# Patient Record
Sex: Female | Born: 1965 | Race: Black or African American | Hispanic: No | Marital: Married
Health system: Southern US, Community
[De-identification: ages and names within clinical notes are randomized; demographics above are authoritative.]

## PROBLEM LIST (undated history)

## (undated) DIAGNOSIS — K219 Gastro-esophageal reflux disease without esophagitis: Secondary | ICD-10-CM

## (undated) DIAGNOSIS — E7800 Pure hypercholesterolemia, unspecified: Secondary | ICD-10-CM

## (undated) DIAGNOSIS — E559 Vitamin D deficiency, unspecified: Secondary | ICD-10-CM

## (undated) DIAGNOSIS — E0500 Thyrotoxicosis with diffuse goiter without thyrotoxic crisis or storm: Secondary | ICD-10-CM

## (undated) DIAGNOSIS — R7303 Prediabetes: Secondary | ICD-10-CM

## (undated) DIAGNOSIS — F419 Anxiety disorder, unspecified: Secondary | ICD-10-CM

## (undated) DIAGNOSIS — M549 Dorsalgia, unspecified: Secondary | ICD-10-CM

## (undated) DIAGNOSIS — T7840XA Allergy, unspecified, initial encounter: Secondary | ICD-10-CM

## (undated) DIAGNOSIS — B019 Varicella without complication: Secondary | ICD-10-CM

## (undated) DIAGNOSIS — I10 Essential (primary) hypertension: Secondary | ICD-10-CM

## (undated) DIAGNOSIS — M2550 Pain in unspecified joint: Secondary | ICD-10-CM

## (undated) DIAGNOSIS — E039 Hypothyroidism, unspecified: Secondary | ICD-10-CM

## (undated) HISTORY — DX: Vitamin D deficiency, unspecified: E55.9

## (undated) HISTORY — DX: Prediabetes: R73.03

## (undated) HISTORY — DX: Gastro-esophageal reflux disease without esophagitis: K21.9

## (undated) HISTORY — DX: Varicella without complication: B01.9

## (undated) HISTORY — DX: Allergy, unspecified, initial encounter: T78.40XA

## (undated) HISTORY — DX: Thyrotoxicosis with diffuse goiter without thyrotoxic crisis or storm: E05.00

## (undated) HISTORY — DX: Essential (primary) hypertension: I10

## (undated) HISTORY — DX: Pain in unspecified joint: M25.50

## (undated) HISTORY — DX: Anxiety disorder, unspecified: F41.9

## (undated) HISTORY — DX: Pure hypercholesterolemia, unspecified: E78.00

## (undated) HISTORY — PX: TUBAL LIGATION: SHX77

## (undated) HISTORY — DX: Hypothyroidism, unspecified: E03.9

## (undated) HISTORY — PX: EYE SURGERY: SHX253

## (undated) HISTORY — DX: Dorsalgia, unspecified: M54.9

## (undated) HISTORY — PX: DILATION AND CURETTAGE OF UTERUS: SHX78

---

## 2001-08-07 ENCOUNTER — Other Ambulatory Visit: Admission: RE | Admit: 2001-08-07 | Discharge: 2001-08-07 | Payer: Self-pay | Admitting: Obstetrics & Gynecology

## 2005-06-30 ENCOUNTER — Ambulatory Visit: Payer: Self-pay | Admitting: Obstetrics & Gynecology

## 2005-07-15 ENCOUNTER — Ambulatory Visit (HOSPITAL_COMMUNITY): Admission: RE | Admit: 2005-07-15 | Discharge: 2005-07-15 | Payer: Self-pay | Admitting: Gynecology

## 2006-07-25 ENCOUNTER — Ambulatory Visit: Payer: Self-pay | Admitting: Family Medicine

## 2006-07-25 ENCOUNTER — Encounter: Payer: Self-pay | Admitting: Family Medicine

## 2006-07-25 LAB — CONVERTED CEMR LAB: Pap Smear: NORMAL

## 2006-07-31 ENCOUNTER — Ambulatory Visit (HOSPITAL_COMMUNITY): Admission: RE | Admit: 2006-07-31 | Discharge: 2006-07-31 | Payer: Self-pay | Admitting: Gynecology

## 2006-10-19 DIAGNOSIS — K219 Gastro-esophageal reflux disease without esophagitis: Secondary | ICD-10-CM

## 2006-10-19 DIAGNOSIS — E66813 Obesity, class 3: Secondary | ICD-10-CM | POA: Insufficient documentation

## 2006-10-19 DIAGNOSIS — E669 Obesity, unspecified: Secondary | ICD-10-CM

## 2006-11-14 ENCOUNTER — Ambulatory Visit: Payer: Self-pay | Admitting: Family Medicine

## 2006-11-14 DIAGNOSIS — N951 Menopausal and female climacteric states: Secondary | ICD-10-CM

## 2006-11-24 ENCOUNTER — Encounter (INDEPENDENT_AMBULATORY_CARE_PROVIDER_SITE_OTHER): Payer: Self-pay | Admitting: *Deleted

## 2007-07-03 ENCOUNTER — Ambulatory Visit: Payer: Self-pay | Admitting: Family Medicine

## 2007-07-24 ENCOUNTER — Ambulatory Visit: Payer: Self-pay | Admitting: Family Medicine

## 2007-08-06 ENCOUNTER — Ambulatory Visit (HOSPITAL_COMMUNITY): Admission: RE | Admit: 2007-08-06 | Discharge: 2007-08-06 | Payer: Self-pay | Admitting: Gynecology

## 2007-09-19 ENCOUNTER — Encounter: Admission: RE | Admit: 2007-09-19 | Discharge: 2007-09-19 | Payer: Self-pay | Admitting: Endocrinology

## 2007-09-28 ENCOUNTER — Encounter: Admission: RE | Admit: 2007-09-28 | Discharge: 2007-09-28 | Payer: Self-pay | Admitting: Endocrinology

## 2007-11-12 ENCOUNTER — Ambulatory Visit: Payer: Self-pay | Admitting: Obstetrics & Gynecology

## 2007-11-12 ENCOUNTER — Encounter: Payer: Self-pay | Admitting: Obstetrics & Gynecology

## 2009-11-30 ENCOUNTER — Ambulatory Visit: Payer: Self-pay | Admitting: Obstetrics & Gynecology

## 2010-03-09 ENCOUNTER — Encounter
Admission: RE | Admit: 2010-03-09 | Discharge: 2010-03-09 | Payer: Self-pay | Source: Home / Self Care | Attending: Ophthalmology | Admitting: Ophthalmology

## 2010-03-10 ENCOUNTER — Ambulatory Visit
Admission: RE | Admit: 2010-03-10 | Discharge: 2010-03-10 | Payer: Self-pay | Source: Home / Self Care | Attending: Ophthalmology | Admitting: Ophthalmology

## 2010-04-27 ENCOUNTER — Other Ambulatory Visit: Payer: Self-pay | Admitting: Obstetrics & Gynecology

## 2010-04-27 DIAGNOSIS — Z1231 Encounter for screening mammogram for malignant neoplasm of breast: Secondary | ICD-10-CM

## 2010-05-10 ENCOUNTER — Ambulatory Visit (HOSPITAL_COMMUNITY)
Admission: RE | Admit: 2010-05-10 | Discharge: 2010-05-10 | Disposition: A | Discharge status: Home / Self Care | Payer: Federal, State, Local not specified - PPO | Source: Ambulatory Visit | Attending: Obstetrics & Gynecology | Admitting: Obstetrics & Gynecology

## 2010-05-10 DIAGNOSIS — Z1231 Encounter for screening mammogram for malignant neoplasm of breast: Secondary | ICD-10-CM

## 2010-05-31 LAB — POCT HEMOGLOBIN-HEMACUE: Hemoglobin: 13 g/dL (ref 12.0–15.0)

## 2010-08-03 NOTE — Assessment & Plan Note (Signed)
NAME:  Delk, Myndi                ACCOUNT NO.:  [id number]   MEDICAL RECORD NO.:  [id number]          PATIENT TYPE:  POB   LOCATION:  [facility]         FACILITY:  [facility]   PHYSICIAN:  Johnella Moloney, MD        DATE OF BIRTH:  [birth year]   DATE OF SERVICE:  11/12/2007                                  CLINIC NOTE   CHIEF COMPLAINT:  Annual examination.   HISTORY OF PRESENT ILLNESS:  The patient is a 45 year old gravida 5,  para 2-0-3-2 with the last menstrual period of November 07, 2007, who is  here for her annual exam.  The patient was last seen in April 2009, for  a complaint of irregular periods.  As a part of the workup with this, a  TSH was checked which was normal.  The patient followed up with her  endocrinologist and was given a diagnosis of Graves disease.  The  patient has undergone radioactive iodine therapy in July 2009, and she  is due for another laboratory check in 6 weeks.  The patient is going to  follow up with her endocrinologist for the management of her Graves  disease.  As for her irregular bleeding, since her radioactive iodine  therapy, the patient has noted that her periods are now regular.  She  does say that they are slightly heavier than usual, but denies having to  change a pad every hour or 2 hours for any other symptoms.  The patient  denies any other gynecological issues.   PAST OBSTETRIC AND GYNECOLOGIC HISTORY:  The patient had 1 cesarean  section, 1 VBAC, and 3 terminations/miscarriages.  She has normal Pap  smears, last one was in Jul 25, 2006.  She denies any sexually  transmitted infections.   PAST MEDICAL HISTORY:  Graves disease.   PAST SURGICAL HISTORY:  Cesarean section and tubal ligation.   MEDICATIONS:  Centrum multivitamin daily.   ALLERGIES:  SULFA.   FAMILY HISTORY:  Notable for grandfather with throat cancer and the  patient also has an extensive family history of diabetes, heart disease,  and [place] blood pressure.   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  She does not smoke, drink alcohol, or use illicit  drugs.  She has never been sexually or physically abused.   REVIEW OF SYSTEMS:  A 14-point comprehensive review of systems was  reviewed with the patient and is negative.   PHYSICAL EXAMINATION:  VITAL SIGNS:  Pulse 71, blood pressure 134/85,  weight 233 pounds, and height 5 feet 4 inches.  GENERAL:  Well-nourished, well-developed female in no acute distress.  HEENT:  Normocephalic and atraumatic.  NECK:  Thyroid normal.  LUNGS:  Clear to auscultation bilaterally.  HEART:  Regular rate and rhythm.  BREASTS:  No masses, nipple discharge, or lymphadenopathy noted,  symmetric in size.  ABDOMEN:  Soft, nontender, and nondistended.  A well-healed Pfannenstiel  incision from her cesarean section and umbilical incision from her  bilateral tubal ligation.  PELVIC:  Normal external female genitalia.  Pink and well-rugated  vagina.  Normal discharge.  Multiparous  cervical os visualized.  Pap  smear sample obtained.  No abnormal drainage or discharge noted.  No  lesions noted on bimanual exam.  Uterus is nontender.  Normal size,  shape, and contour.  Normal adnexa bilaterally.  No tenderness elicited.  EXTREMITIES:  No cyanosis, clubbing, or edema.   ASSESSMENT AND PLAN:  The patient is a 45 year old female here for  annual exam.  A Pap smear was done today.  We will follow up results.  Of note, the patient had a mammogram that was done on Aug 06, 2007,  which was normal.  As for her obesity, the patient is continuing to  exercise and is on a diet.  Of note, she has lost about 27 pounds from  her last annual exam.  The patient was commented on her weight loss  effort and encouraged to continue.  She was told to come back to the GYN  clinic as needed for any gynecologic issues or if next year, for her  annual exam.            ______________________________  Johnella Moloney, MD     UD/MEDQ  D:  11/12/2007  T:  11/13/2007  Job:  [id number]

## 2010-08-03 NOTE — Assessment & Plan Note (Signed)
NAME:  Vanessa Gardner, Vanessa Gardner                ACCOUNT NO.:  [id number]   MEDICAL RECORD NO.:  [id number]          PATIENT TYPE:  POB   LOCATION:  [facility]         FACILITY:  [facility]   PHYSICIAN:  Allie Bossier, MD        DATE OF BIRTH:  [birth year]   DATE OF SERVICE:  11/30/2009                                  CLINIC NOTE   Vanessa Gardner is a 45 year old married black G4, P2, A2 who comes in here for  her annual exam.  She has a 48 year old son and a 68 year old daughter.  Her only complaint today is that of some urinary leakage.   PAST MEDICAL HISTORY:  Morbid obesity, Graves disease leading to  hypothyroidism - Dr. Talmage Nap.   SOCIAL HISTORY:  She has been married for 18 years.  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  She denies any dyspareunia.   PAST SURGICAL HISTORY:  Tubal ligation, D&C x2.   FAMILY HISTORY:  Negative for breast, GYN, and colon malignancies.   No latex allergies.  Medicine allergy is SULFA.   MEDICATIONS:  Synthroid 125 mcg daily.   SOCIAL HISTORY:  Negative for tobacco, alcohol, or drug use.   PHYSICAL EXAMINATION:  GENERAL:  Well-nourished, well-hydrated, very  pleasant black female, height 5-4, weight 272 pounds, blood pressure  133/99, pulse 73.  HEENT:  Normal.  BREASTS:  Normal bilaterally.  ABDOMEN:  Obese, benign.  HEART:  Regular rate and rhythm.  LUNGS:  Clear to auscultation bilaterally.  PELVIC:  External genitalia, no lesions.  Cervix parous.  Normal  discharge.  Uterus normal size and shape, anteverted, mobile.  Adnexa  nontender.  No masses.   ASSESSMENT AND PLAN:  1. Annual exam.  Checked the Pap smear.  Recommended self-breast and      self-vulvar exams.  We are scheduling a mammogram.  2. Encourage weight loss.  3. Stress incontinence.  I have recommended she go exercises and      weight loss and then we can consider surgery once her centripetal      obesity is less.      Allie Bossier, MD     MCD/MEDQ  D:  11/30/2009   T:  12/01/2009  Job:  [id number]

## 2010-08-03 NOTE — Assessment & Plan Note (Signed)
NAME:  Vanessa Gardner, Vanessa Gardner                ACCOUNT NO.:  [id number]   MEDICAL RECORD NO.:  [id number]          PATIENT TYPE:  POB   LOCATION:  [facility]         FACILITY:  [facility]   PHYSICIAN:  Tinnie Gens, MD        DATE OF BIRTH:  [birth year]   DATE OF SERVICE:  07/03/2007                                  CLINIC NOTE   CHIEF COMPLAINT:  Irregular periods.   HISTORY OF PRESENT ILLNESS:  This patient is a 45 year old gravida 5,  para 2-0-3-2, who normally has a cycle approximately every 25 days, and  she had two cycles in February that were approximately 17 days apart and  then a 30-day cycle in March.  The patient does have significant hot  flashes.  Her mother went through menopause in her mid 59s.  She has  lost almost 40 pounds since her last visit in May of last year.  Last  Pap and mammogram were within normal limits.   PHYSICAL EXAMINATION:  VITAL SIGNS:  Are as noted in the chart.  GENERAL:  She is an obese female in no acute distress.  GU:  Normal external female genitalia, BUS normal.  Vagina is pink and  rugated.  Cervix is parous without lesions.  Uterus was hard to outline,  does not feel markedly enlarged.  There are no adnexal masses or  tenderness.   IMPRESSION:  Irregular cycle x2.  It is hard to pinpoint exactly how  different this is from normal for her.  The lasted the same amount of  time.  She had no increase in bleeding.  Suspect this is hormonally  related.   PLAN:  Check TSH and FSH.  Get one more month fourth of cycles.  Will  see her in May for her yearly and review this ongoing period situation  at that time.  I do think the patient needs endometrial sampling unless  her cycles become increasingly heavy.           ______________________________  Tinnie Gens, MD     TP/MEDQ  D:  07/03/2007  T:  07/03/2007  Job:  [id number]

## 2010-08-06 NOTE — Group Therapy Note (Signed)
NAME:  Yard, Artina                ACCOUNT NO.:  [id number]   MEDICAL RECORD NO.:  [id number]          PATIENT TYPE:  POB   [facility]ATION[facility]  [facility]                   FACILITY:  [facility]   PHYSICIAN:  Tinnie Gens, MD        DATE OF BIRTH:  [birth year]   DATE OF SERVICE:  07/25/2006                                  CLINIC NOTE   Ms. Berryman is a 45 year old black female gravida 5, para 2-0-3-2 with her  last menstrual period of June 28, 2006.  She was last seen in the office  in April 2007.  She denies any problems.  She reports regular cycles  coming every month, lasting 4 days.  She denies menopausal symptoms.   PAST MEDICAL HISTORY:  Denies any medical problems.   PAST SURGICAL HISTORY:  Cesarean section and BTL.   PAST OBSTETRICAL HISTORY:  One cesarean section, one VBAC, and three  terminations/miscarriages.   GYNECOLOGICAL HISTORY:  No history of sexually-transmitted diseases.  Denies abnormal Pap smears.   ALLERGIES:  SULFA.   MEDICATIONS:  None.   REVIEW OF SYSTEMS:  A 13-point review of systems is negative.   FAMILY HISTORY:  Unchanged.   SOCIAL HISTORY:  The patient does not smoke.  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   PHYSICAL EXAMINATION:  VITAL SIGNS:  Pulse 71, blood pressure 147/87,  weight 259.  GENERAL:  Well-nourished, well-developed female in no acute distress.  HEENT:  Normocephalic.  Mouth:  No abnormalities noted.  NECK:  Thyroid without enlargement.  LUNGS:  Bilaterally clear to A&P.  HEART:  Rate and rhythm regular without murmurs, gallops or cardiac  enlargement.  BREASTS:  No masses or nipple discharge.  No lymphadenopathy.  ABDOMEN:  Soft and nontender without organomegaly.  She does have  central obesity.  GENITALIA:  External genitalia within normal limits for female.  Vagina  is clean and rugous.  The cervix is parous without irrigation or  discharge.  The uterus is nontender; normal size, shape and contour.  Adnexa  bilaterally nontender without palpable masses.  Rectovaginal exam  confirms.  EXTREMITIES:  No edema.   ASSESSMENT:  1. Normal gynecologic examination.  2. Obesity.   PLAN:  Pap smear was obtained and sent to the laboratory.  At the  patient's request, TSH was drawn.  Encouraged the patient to continue  her exercise program and to reduce portion sizes.  I encouraged her to  get back in touch with Dr. Lorenz Coaster to have someone to follow her  medically.  She is to return to the office in 1 year for her yearly  exam.     ______________________________  Matt Holmes, N.P.    ______________________________  Tinnie Gens, MD    EMK/MEDQ  D:  07/25/2006  T:  07/25/2006  Job:  [id number]

## 2010-08-06 NOTE — Group Therapy Note (Signed)
NAMELottie Gardner                 ACCOUNT NO.:  [id number]   MEDICAL RECORD NO.:  [id number]           PATIENT TYPE:   [facility]ATION[facility]  [facility]                     FACILITY:   PHYSICIAN:  Elsie Lincoln, MD           DATE OF BIRTH:   DATE OF SERVICE:  06/30/2005                                    CLINIC NOTE   The patient is a 45 year old, G5, para 2-0-3-2, last menstrual period June 13, 2005 who presents as a new patient for a routine physical exam.  The  patient's last Pap smear was 4 years ago and been last seen by Dr. Sharlot Gowda in  [place], but has not had a recent gynecological visit.  She has  absolutely no complaints.  She is sexually active and has no problems with  libido.  She has no urinary complaints.  She has regular periods.  She has  no fatigue, depression, hot flashes or flushes.  She does want a Pap smear  today.  She has never had a mammogram and she has never had a guaiac done of  her stool.   PAST MEDICAL HISTORY:  Denies all medical problems.   PAST SURGICAL HISTORY:  C-section and BTL.   PAST OBSTETRICAL HISTORY:  One C-section and 1 VBAC, and 3  terminations/miscarriages.   GYNECOLOGICAL HISTORY:  No history of sexually transmitted diseases.  No  abnormal Pap smears.  No ovarian cysts and no fibroid tumors of the uterus.   ALLERGIES:  SULFA.   MEDICATIONS:  None.   REVIEW OF SYMPTOMS:  A 13-point review of symptoms is negative.   FAMILY HISTORY:  Grandparents have diabetes, father has heart disease,  mother and father have Pons blood pressure and a grandfather has cancer.   SOCIAL HISTORY:  She does not smoke.  She drinks and occasional alcohol  drink and 1 caffeinated beverage a day.  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  She has never been sexually or physically  abused.   PHYSICAL EXAMINATION:  VITAL SIGNS:  Blood pressure 124/88, weight 251 and  pulse 76.  GENERAL:  Well-nourished, well-developed in no apparent  distress.  HEENT:  Normocephalic, atraumatic.  MOUTH:  Good dentition.  NECK:  Thyroid normal.  LUNGS:  Clear to auscultation bilaterally.  HEART:  Regular rate and rhythm.  BREASTS:  No masses, skin changes, dimpling or breast discharge.  No  lymphadenopathy.  ABDOMEN:  Soft, nontender and nondistended.  No organomegaly.  No hernia.  Well-healed umbilical skin incision from tubal and well-healed Pfannenstiel  skin incision from C-section.  GENITALIA:  Tanner V.  Vulva no lesions.  Urethra no prolapse.  Urethra  meatus no prolapse.  Urethra nontender.  Vagina pink, normal rugae and no  blood or discharge.  Cervix closed and nontender.  Uterus and adnexa are  difficult to palpate secondary to body habitus, however, nontender.  Rectovaginal exam no masses or hemorrhoids.  Guaiac done.  EXTREMITIES:  No edema.   ASSESSMENT AND PLAN:  A 45 year old female for Pap smear, cultures,  mammogram and  guaiac.  The patient is obese and was told to continue  exercising and to begin her diet by cutting out beverages that have  calories.  She does need to come back in for a TSH, fasting glucose and  fasting lipid panel.  We will then call her with those results and then have  her come back in 1 year for an exam or sooner as needed.           ______________________________  Elsie Lincoln, MD     KL/MEDQ  D:  06/30/2005  T:  06/30/2005  Job:  [id number]

## 2011-03-22 HISTORY — PX: EYE SURGERY: SHX253

## 2011-05-25 ENCOUNTER — Other Ambulatory Visit: Payer: Self-pay | Admitting: Obstetrics & Gynecology

## 2011-05-25 DIAGNOSIS — Z1231 Encounter for screening mammogram for malignant neoplasm of breast: Secondary | ICD-10-CM

## 2011-06-20 ENCOUNTER — Ambulatory Visit (HOSPITAL_COMMUNITY): Payer: Federal, State, Local not specified - PPO

## 2015-08-24 ENCOUNTER — Encounter: Payer: Self-pay | Admitting: Primary Care

## 2015-08-24 ENCOUNTER — Ambulatory Visit (INDEPENDENT_AMBULATORY_CARE_PROVIDER_SITE_OTHER): Payer: Federal, State, Local not specified - PPO | Admitting: Primary Care

## 2015-08-24 VITALS — BP 122/70 | HR 63 | Temp 98.0°F | Ht 63.5 in | Wt 253.1 lb

## 2015-08-24 DIAGNOSIS — E039 Hypothyroidism, unspecified: Secondary | ICD-10-CM | POA: Diagnosis not present

## 2015-08-24 DIAGNOSIS — N926 Irregular menstruation, unspecified: Secondary | ICD-10-CM | POA: Diagnosis not present

## 2015-08-24 DIAGNOSIS — E669 Obesity, unspecified: Secondary | ICD-10-CM

## 2015-08-24 DIAGNOSIS — K219 Gastro-esophageal reflux disease without esophagitis: Secondary | ICD-10-CM

## 2015-08-24 NOTE — Progress Notes (Signed)
Subjective:    Patient ID: Vanessa LentFilicia M Gardner, female    DOB: Aug 15, 1965, 50 y.o.   MRN: [id number]  HPI  Vanessa Gardner is a 50 year old female who presents today to establish care and discuss the problems mentioned below. Will obtain old records. Her last physical was in April 2016. She follows with GYN annually.   1) Hypothyroidism: Diagnosed in 2009. Currently managed on levothyroxine 137 mcg and has been on this dose since November 2016. Denies weight loss, hair loss, fatigue. She follows with an endocrinologist.   2) Essential Hypertension: Diagnosed in 2011. Currently managed on losartan-HCTZ 50/12.5 mg. Her blood pressure is stable in the clinic today. Denies chest pain, SOB, dizziness, lower extremity edema.  3) Irregular Menstrual Cycle: Present for the past 2 years. Menstrual cycles over the past 2 years were every 24-26 days. Her LMP was 06/11/15. Denies any chance of pregnancy. Tubal ligation in her mid 30's. She is experiencing night sweats. Denies unexplained weight loss, pelvic pain, cramping.  4) Obesity: Present for years. She was once successful on weight watchers as she lost 40 pounds. She has not been exercising and has not been eating as healthy as she once did in the past. Lately she's been eating out at restaurants.   Her diet currently consists of: Breakfast:Boiled eggs, [place]aw[place]urkey bacon Lunch: Protein, vegetables, salad, eating out Dinner: Protein, vegetables, salad, eating out Snacks: Fruit, chips, popcorn Desserts: Occasionally Beverages: Water (24 ounces daily), coffee, occasional diet soda  Exercise: She does not currently exercise, but plans on starting today as she joined a new gym.  Review of Systems  Constitutional: Negative for unexpected weight change.  Respiratory: Negative for shortness of breath.   Cardiovascular: Negative for chest pain.  Gastrointestinal:       Gerd  Genitourinary: Negative for dysuria, frequency, vaginal discharge and pelvic pain.   Irregular menstrual cycle  Neurological: Negative for dizziness and headaches.       Past Medical History  Diagnosis Date  . Chicken pox   . GERD (gastroesophageal reflux disease)   . Hypothyroidism   . Essential hypertension   . Graves disease      Social History   Social History  . Marital Status: Married    Spouse Name: N/A  . Number of Children: N/A  . Years of Education: N/A   Occupational History  . Not on file.   Social History Main Topics  . Smoking status: Never Smoker   . Smokeless tobacco: Not on file  . Alcohol Use: 0.0 oz/week    0 Standard drinks or equivalent per week  . Drug Use: No  . Sexual Activity: Not on file   Other Topics Concern  . Not on file   Social History Narrative   Married.   2 children.   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   Enjoys exercising, going to the movies, shopping.     Past Surgical History  Procedure Laterality Date  . Tubal ligation    . Eye surgery Right 2013    Family History  Problem Relation Age of Onset  . Alcohol abuse Father   . Hypertension Father   . Cirrhosis Father   . Hypertension Mother     Allergies  Allergen Reactions  . Cefuroxime Axetil     REACTION: swelling  . Norvasc [Amlodipine Besylate] Swelling  . Sulfa Antibiotics     No current outpatient prescriptions on file prior to visit.   No current facility-administered medications on file  prior to visit.    BP 122/70 mmHg  Pulse 63  Temp(Src) 98 F (36.7 C) (Oral)  Ht 5' 3.5" (1.613 m)  Wt 253 lb 1.9 oz (114.814 kg)  BMI 44.13 kg/m2  SpO2 96%  LMP 08/21/2015    Objective:   Physical Exam  Constitutional: She appears well-nourished.  Neck: Neck supple.  Cardiovascular: Normal rate and regular rhythm.   Pulmonary/Chest: Effort normal and breath sounds normal.  Skin: Skin is warm and dry.  Psychiatric: She has a normal mood and affect.          Assessment & Plan:

## 2015-08-24 NOTE — Assessment & Plan Note (Signed)
Occasional. Manages with avoiding triggers and avoid laying flat after meals. Not currently on medication.

## 2015-08-24 NOTE — Progress Notes (Signed)
Pre visit review using our clinic review tool, if applicable. No additional management support is needed unless otherwise documented below in the visit note. 

## 2015-08-24 NOTE — Assessment & Plan Note (Signed)
Changes in menstrual cycle for the past 2 years. LMP June 11, 2015. History of Tubal Ligation in her mid 5830's. Suspect peri-menopausal as she lacks any other symptoms. Does have night sweats.  Will have her continue to monitor cycles.

## 2015-08-24 NOTE — Assessment & Plan Note (Signed)
Long history of, once successful on weight watchers, plans on re-starting. Fair diet, no exercise, not drinking enough water. Discussed changes in diet, increase consumption of water intake, and start exercising.

## 2015-08-24 NOTE — Assessment & Plan Note (Signed)
Currently managed by endocrinology. Stable on levothyroxine 137 mcg. Denies fatigue, weight loss, hair loss.

## 2015-08-24 NOTE — Patient Instructions (Signed)
Continue to work on improvements in your diet as discussed.  Ensure you are consuming 64 ounces of water daily.  Start exercising. You should be getting 1 hour of moderate intensity exercise 5 days weekly.  Please schedule a physical with me this summer at your convenience. You may also schedule a lab only appointment 3-4 days prior. We will discuss your lab results in detail during your physical.  It was a pleasure to meet you today! Please don't hesitate to call me with any questions. Welcome to [facility]!

## 2015-09-17 ENCOUNTER — Other Ambulatory Visit: Payer: Self-pay | Admitting: Primary Care

## 2015-09-17 DIAGNOSIS — E039 Hypothyroidism, unspecified: Secondary | ICD-10-CM

## 2015-09-17 DIAGNOSIS — Z0000 Encounter for general adult medical examination without abnormal findings: Secondary | ICD-10-CM

## 2015-09-17 DIAGNOSIS — I10 Essential (primary) hypertension: Secondary | ICD-10-CM

## 2015-09-25 ENCOUNTER — Other Ambulatory Visit (INDEPENDENT_AMBULATORY_CARE_PROVIDER_SITE_OTHER): Payer: Federal, State, Local not specified - PPO

## 2015-09-25 DIAGNOSIS — Z0000 Encounter for general adult medical examination without abnormal findings: Secondary | ICD-10-CM | POA: Diagnosis not present

## 2015-09-25 DIAGNOSIS — I10 Essential (primary) hypertension: Secondary | ICD-10-CM

## 2015-09-25 DIAGNOSIS — E039 Hypothyroidism, unspecified: Secondary | ICD-10-CM

## 2015-09-25 LAB — COMPREHENSIVE METABOLIC PANEL
ALBUMIN: 3.9 g/dL (ref 3.5–5.2)
ALK PHOS: 80 U/L (ref 39–117)
ALT: 20 U/L (ref 0–35)
AST: 28 U/L (ref 0–37)
BILIRUBIN TOTAL: 0.4 mg/dL (ref 0.2–1.2)
BUN: 15 mg/dL (ref 6–23)
CO2: 32 mEq/L (ref 19–32)
Calcium: 9.4 mg/dL (ref 8.4–10.5)
Chloride: 102 mEq/L (ref 96–112)
Creatinine, Ser: 0.84 mg/dL (ref 0.40–1.20)
GFR: 91.84 mL/min (ref >60.00)
Glucose, Bld: 102 mg/dL — ABNORMAL HIGH (ref 70–99)
POTASSIUM: 3.7 meq/L (ref 3.5–5.1)
Sodium: 138 mEq/L (ref 135–145)
TOTAL PROTEIN: 7.2 g/dL (ref 6.0–8.3)

## 2015-09-25 LAB — LIPID PANEL
Cholesterol: 170 mg/dL (ref 0–200)
HDL: 44.1 mg/dL (ref >39.00)
LDL CALC: 110 mg/dL — AB (ref 0–99)
NonHDL: 125.75
TRIGLYCERIDES: 81 mg/dL (ref 0.0–149.0)
Total CHOL/HDL Ratio: 4
VLDL: 16.2 mg/dL (ref 0.0–40.0)

## 2015-09-25 LAB — VITAMIN D 25 HYDROXY (VIT D DEFICIENCY, FRACTURES): VITD: 35.7 ng/mL (ref 30.00–100.00)

## 2015-09-25 LAB — TSH: TSH: 0.78 u[IU]/mL (ref 0.35–4.50)

## 2015-09-25 LAB — HEMOGLOBIN A1C: HEMOGLOBIN A1C: 5.9 % (ref 4.6–6.5)

## 2015-10-01 ENCOUNTER — Encounter: Payer: Self-pay | Admitting: Primary Care

## 2015-10-01 ENCOUNTER — Ambulatory Visit (INDEPENDENT_AMBULATORY_CARE_PROVIDER_SITE_OTHER): Payer: Federal, State, Local not specified - PPO | Admitting: Primary Care

## 2015-10-01 VITALS — BP 122/74 | HR 63 | Temp 98.2°F | Ht 64.0 in | Wt 252.1 lb

## 2015-10-01 DIAGNOSIS — Z1211 Encounter for screening for malignant neoplasm of colon: Secondary | ICD-10-CM

## 2015-10-01 DIAGNOSIS — N926 Irregular menstruation, unspecified: Secondary | ICD-10-CM | POA: Diagnosis not present

## 2015-10-01 DIAGNOSIS — E669 Obesity, unspecified: Secondary | ICD-10-CM

## 2015-10-01 DIAGNOSIS — Z23 Encounter for immunization: Secondary | ICD-10-CM | POA: Diagnosis not present

## 2015-10-01 DIAGNOSIS — R7303 Prediabetes: Secondary | ICD-10-CM | POA: Insufficient documentation

## 2015-10-01 DIAGNOSIS — E039 Hypothyroidism, unspecified: Secondary | ICD-10-CM

## 2015-10-01 DIAGNOSIS — Z0001 Encounter for general adult medical examination with abnormal findings: Secondary | ICD-10-CM | POA: Insufficient documentation

## 2015-10-01 DIAGNOSIS — Z0000 Encounter for general adult medical examination without abnormal findings: Secondary | ICD-10-CM

## 2015-10-01 NOTE — Assessment & Plan Note (Signed)
A1C of 5.9 on recent labs. Discussed changes in diet. She is exercising, commended her on these efforts.  Will repeat A1C in 6 months.

## 2015-10-01 NOTE — Assessment & Plan Note (Signed)
Exercising in the gym and also with boot camp. Commended her on her efforts. Discussed the importance of a healthy diet and regular exercise in order for weight loss and to reduce risk of other medical diseases.

## 2015-10-01 NOTE — Assessment & Plan Note (Signed)
TSH stable on recent labs. Continue levothyroxine 137 mcg.

## 2015-10-01 NOTE — Assessment & Plan Note (Signed)
Td due, provided today. Pap and mammogram UTD. Colonoscopy due, referral placed. Eye and dental exams UTD. Exam unremarkable. Labs with prediabetes, discussed this with patient. Follow up in 1 year for repeat physical.

## 2015-10-01 NOTE — Addendum Note (Signed)
Addended by: Tawnya CrookSAMBATH, Saide Lanuza on: 10/01/2015 11:19 AM   Modules accepted: Orders, SmartSet

## 2015-10-01 NOTE — Patient Instructions (Signed)
You were provided with a tetanus vaccination that will cover you for 10 years.  Continue to make improvements on your diet. Limit carbohydrates in the form of white bread, rice, pasta, chips, crackers, etc. Increase your consumption of fresh fruits and vegetables.  Ensure you are consuming 64 ounces of water daily.  Continue exercising! Great job!  Start a Calcium with Vitamin D supplement. Take this daily.  You will be contacted regarding your referral to GI for the colonoscopy.  Please let us know if you have not heard back within one week.   We will continue to monitor your blood sugar levels. Schedule a lab only appointment in 6 months.  Follow up in 1 year for repeat physical or sooner if needed.  It was a pleasure to see you today!

## 2015-10-01 NOTE — Assessment & Plan Note (Signed)
Continues. Also with hot flashes. Discussed perimenopausal symptoms and process with patient.

## 2015-10-01 NOTE — Progress Notes (Signed)
Subjective:    Patient ID: Vanessa Gardner, female    DOB: [birth year], 50 y.o.   MRN: [id number]  HPI  Vanessa Gardner is a 50 year old female who presents today for complete physical.  Immunizations: -Tetanus: Last completed in 2003, due today. -Influenza: Did not complete last season.  Diet: She endorses a fair diet. Breakfast: Boiled egg, [place] bacon Lunch: Meat, vegetable, salad, pasta; does eat out occasionally (tries to make healthier choices). Dinner: Meat, vegetables, popcorn, chips Snacks: Yogurt, chips, popcorn, crackers Desserts: Occasionally Beverages: Water (limited), occasional diet, coffee  Exercise: Currently involved in exercises classes several days weekly. Eye exam: Completed in March 2017 Dental exam: Completed in February 2017 Colonoscopy: Never completed, due. Pap Smear: Completed in September 2016, normal. Mammogram: Completed in September 2016, normal.   Review of Systems  Constitutional: Negative for unexpected weight change.  HENT: Negative for rhinorrhea.   Respiratory: Negative for cough and shortness of breath.   Cardiovascular: Negative for chest pain.  Gastrointestinal: Negative for diarrhea and constipation.  Genitourinary: Negative for difficulty urinating.       Perimenopausal symptoms  Musculoskeletal: Negative for myalgias and arthralgias.  Skin: Negative for rash.  Allergic/Immunologic: Negative for environmental allergies.  Neurological: Negative for dizziness, numbness and headaches.  Psychiatric/Behavioral:       Denies concerns for anxiety or depression.       Past Medical History  Diagnosis Date  . Chicken pox   . GERD (gastroesophageal reflux disease)   . Hypothyroidism   . Essential hypertension   . Graves disease      Social History   Social History  . Marital Status: Married    Spouse Name: N/A  . Number of Children: N/A  . Years of Education: N/A   Occupational History  . Not on file.   Social History Main  Topics  . Smoking status: Never Smoker   . Smokeless tobacco: Not on file  . Alcohol Use: 0.0 oz/week    0 Standard drinks or equivalent per week  . Drug Use: No  . Sexual Activity: Not on file   Other Topics Concern  . Not on file   Social History Narrative   Married.   2 children.   Works as a Child psychotherapist.   Enjoys exercising, going to the movies, shopping.     Past Surgical History  Procedure Laterality Date  . Tubal ligation    . Eye surgery Right 2013    Family History  Problem Relation Age of Onset  . Alcohol abuse Father   . Hypertension Father   . Cirrhosis Father   . Hypertension Mother     Allergies  Allergen Reactions  . Cefuroxime Axetil     REACTION: swelling  . Norvasc [Amlodipine Besylate] Swelling  . Sulfa Antibiotics     Current Outpatient Prescriptions on File Prior to Visit  Medication Sig Dispense Refill  . cholecalciferol (VITAMIN D) 1000 units tablet Take 1,000 Units by mouth daily.    Marland Kitchen levothyroxine (SYNTHROID, LEVOTHROID) 137 MCG tablet Take 137 mcg by mouth daily before breakfast.    . losartan-hydrochlorothiazide (HYZAAR) 50-12.5 MG tablet Take 1 tablet by mouth daily.     No current facility-administered medications on file prior to visit.    BP 122/74 mmHg  Pulse 63  Temp(Src) 98.2 F (36.8 C) (Oral)  Ht  (1.626 m)  Wt 252 lb 1.9 oz (114.361 kg)  BMI 43.26 kg/m2  SpO2 97%  LMP 08/21/2015  Objective:   Physical Exam  Constitutional: She is oriented to person, place, and time. She appears well-nourished.  HENT:  Right Ear: Tympanic membrane and ear canal normal.  Left Ear: Tympanic membrane and ear canal normal.  Nose: Nose normal.  Mouth/Throat: Oropharynx is clear and moist.  Eyes: Conjunctivae and EOM are normal. Pupils are equal, round, and reactive to light.  Neck: Neck supple. No thyromegaly present.  Cardiovascular: Normal rate and regular rhythm.   No murmur heard. Pulmonary/Chest: Effort normal and  breath sounds normal. She has no rales.  Abdominal: Soft. Bowel sounds are normal. There is no tenderness.  Musculoskeletal: Normal range of motion.  Lymphadenopathy:    She has no cervical adenopathy.  Neurological: She is alert and oriented to person, place, and time. She has normal reflexes. No cranial nerve deficit.  Skin: Skin is warm and dry. No rash noted.  Psychiatric: She has a normal mood and affect.          Assessment & Plan:

## 2015-10-01 NOTE — Progress Notes (Signed)
Pre visit review using our clinic review tool, if applicable. No additional management support is needed unless otherwise documented below in the visit note. 

## 2015-10-06 ENCOUNTER — Encounter: Payer: Self-pay | Admitting: Gastroenterology

## 2015-12-01 ENCOUNTER — Ambulatory Visit (AMBULATORY_SURGERY_CENTER): Payer: Self-pay | Admitting: *Deleted

## 2015-12-01 VITALS — Ht 64.5 in | Wt 254.8 lb

## 2015-12-01 DIAGNOSIS — Z1211 Encounter for screening for malignant neoplasm of colon: Secondary | ICD-10-CM

## 2015-12-01 MED ORDER — NA SULFATE-K SULFATE-MG SULF 17.5-3.13-1.6 GM/177ML PO SOLN: 1.0000 | Freq: Once | ORAL | 0 refills | Status: AC

## 2015-12-01 NOTE — Progress Notes (Signed)
Denies allergies to eggs or soy products. Denies complications with sedation or anesthesia. Denies O2 use. Denies use of diet or weight loss medications.  Emmi instructions given for colonoscopy.  

## 2015-12-07 ENCOUNTER — Encounter: Payer: Self-pay | Admitting: Gastroenterology

## 2015-12-15 ENCOUNTER — Encounter: Payer: Federal, State, Local not specified - PPO | Admitting: Gastroenterology

## 2016-03-24 ENCOUNTER — Other Ambulatory Visit: Payer: Self-pay | Admitting: Primary Care

## 2016-03-24 DIAGNOSIS — R7303 Prediabetes: Secondary | ICD-10-CM

## 2016-04-04 ENCOUNTER — Other Ambulatory Visit (INDEPENDENT_AMBULATORY_CARE_PROVIDER_SITE_OTHER): Payer: Federal, State, Local not specified - PPO

## 2016-04-04 DIAGNOSIS — R7303 Prediabetes: Secondary | ICD-10-CM | POA: Diagnosis not present

## 2016-04-04 LAB — HEMOGLOBIN A1C: Hgb A1c MFr Bld: 5.8 % (ref 4.6–6.5)

## 2017-03-22 ENCOUNTER — Telehealth: Payer: Self-pay | Admitting: Primary Care

## 2017-03-22 DIAGNOSIS — Z0000 Encounter for general adult medical examination without abnormal findings: Secondary | ICD-10-CM

## 2017-03-22 DIAGNOSIS — R7303 Prediabetes: Secondary | ICD-10-CM

## 2017-03-22 DIAGNOSIS — E039 Hypothyroidism, unspecified: Secondary | ICD-10-CM

## 2017-03-22 NOTE — Telephone Encounter (Signed)
Noted, lab orders placed. 

## 2017-03-22 NOTE — Telephone Encounter (Signed)
Copied from CRM [phone]. Topic: General - Other >> Mar 22, 2017 10:39 AM Cipriano BunkerLambe, Annette S wrote: Reason for CRM: patient coming in for physical on 1/28,  she has appt. For labs on 1/25. Will need lab order

## 2017-04-14 ENCOUNTER — Other Ambulatory Visit (INDEPENDENT_AMBULATORY_CARE_PROVIDER_SITE_OTHER): Payer: Federal, State, Local not specified - PPO

## 2017-04-14 DIAGNOSIS — E039 Hypothyroidism, unspecified: Secondary | ICD-10-CM | POA: Diagnosis not present

## 2017-04-14 DIAGNOSIS — Z0000 Encounter for general adult medical examination without abnormal findings: Secondary | ICD-10-CM | POA: Diagnosis not present

## 2017-04-14 DIAGNOSIS — R7303 Prediabetes: Secondary | ICD-10-CM

## 2017-04-14 LAB — LIPID PANEL
Cholesterol: 172 mg/dL (ref 0–200)
HDL: 48.1 mg/dL (ref >39.00)
LDL CALC: 110 mg/dL — AB (ref 0–99)
NONHDL: 123.59
Total CHOL/HDL Ratio: 4
Triglycerides: 68 mg/dL (ref 0.0–149.0)
VLDL: 13.6 mg/dL (ref 0.0–40.0)

## 2017-04-14 LAB — COMPREHENSIVE METABOLIC PANEL
ALT: 17 U/L (ref 0–35)
AST: 25 U/L (ref 0–37)
Albumin: 4 g/dL (ref 3.5–5.2)
Alkaline Phosphatase: 69 U/L (ref 39–117)
BILIRUBIN TOTAL: 0.5 mg/dL (ref 0.2–1.2)
BUN: 19 mg/dL (ref 6–23)
CO2: 33 meq/L — AB (ref 19–32)
CREATININE: 0.82 mg/dL (ref 0.40–1.20)
Calcium: 9.1 mg/dL (ref 8.4–10.5)
Chloride: 99 mEq/L (ref 96–112)
GFR: 93.86 mL/min (ref >60.00)
GLUCOSE: 94 mg/dL (ref 70–99)
Potassium: 3.3 mEq/L — ABNORMAL LOW (ref 3.5–5.1)
SODIUM: 139 meq/L (ref 135–145)
Total Protein: 7.6 g/dL (ref 6.0–8.3)

## 2017-04-14 LAB — HEMOGLOBIN A1C: Hgb A1c MFr Bld: 6 % (ref 4.6–6.5)

## 2017-04-14 LAB — TSH: TSH: 0.25 u[IU]/mL — AB (ref 0.35–4.50)

## 2017-04-17 ENCOUNTER — Ambulatory Visit (INDEPENDENT_AMBULATORY_CARE_PROVIDER_SITE_OTHER): Payer: Federal, State, Local not specified - PPO | Admitting: Primary Care

## 2017-04-17 ENCOUNTER — Encounter: Payer: Self-pay | Admitting: Primary Care

## 2017-04-17 VITALS — BP 124/70 | HR 71 | Temp 98.1°F | Ht 64.5 in | Wt 258.4 lb

## 2017-04-17 DIAGNOSIS — Z1231 Encounter for screening mammogram for malignant neoplasm of breast: Secondary | ICD-10-CM

## 2017-04-17 DIAGNOSIS — R7303 Prediabetes: Secondary | ICD-10-CM

## 2017-04-17 DIAGNOSIS — Z1211 Encounter for screening for malignant neoplasm of colon: Secondary | ICD-10-CM

## 2017-04-17 DIAGNOSIS — E669 Obesity, unspecified: Secondary | ICD-10-CM | POA: Diagnosis not present

## 2017-04-17 DIAGNOSIS — E039 Hypothyroidism, unspecified: Secondary | ICD-10-CM | POA: Diagnosis not present

## 2017-04-17 DIAGNOSIS — Z0000 Encounter for general adult medical examination without abnormal findings: Secondary | ICD-10-CM | POA: Diagnosis not present

## 2017-04-17 DIAGNOSIS — E876 Hypokalemia: Secondary | ICD-10-CM

## 2017-04-17 DIAGNOSIS — Z1239 Encounter for other screening for malignant neoplasm of breast: Secondary | ICD-10-CM

## 2017-04-17 MED ORDER — LEVOTHYROXINE SODIUM 125 MCG PO TABS: 125.0000 ug | ORAL_TABLET | Freq: Every day | ORAL | 1 refills | Status: DC

## 2017-04-17 NOTE — Patient Instructions (Signed)
We've reduced your dose of levothyroxine from 137 mcg to 125 mcg. Take 1 tablet by mouth every morning on an empty stomach with a full glass of water.  [facility].  You will be contacted regarding your referral to GI for the colonoscopy.  Please let us know if you have not been contacted within one week.   Continue exercising. You should be getting 150 minutes of moderate intensity exercise weekly.  Continue to work on your diet through [employer].  Schedule a lab appointment in 6 weeks to recheck your thyroid function and potassium.   Schedule a lab only appointment in 6 months to recheck your sugar levels.  Follow up in 1 year for your annual exam or sooner if needed.  It was a pleasure to see you today!

## 2017-04-17 NOTE — Assessment & Plan Note (Signed)
Recent TSH of 0.25. Decrease levothyroxine to 125 mcg once daily. Repeat TSH in 6 weeks.

## 2017-04-17 NOTE — Progress Notes (Signed)
Subjective:    Patient ID: Vanessa Gardner, female    DOB: January 17, 1966, 52 y.o.   MRN: [id number]  HPI  Vanessa Gardner is a 52 year old female who presents today for complete physical. She is also needing medication refills.   She is no longer following with endocrinology and has not had her TSH checked since 2017. She is still compliant to her levothyroxine to her dose of 137 mcg.    Immunizations: -Tetanus: Completed in 2017 -Influenza: Completed in October 2018   Diet: She endorses a healthy diet. She joined [employer] watchers yesterday.  Breakfast: Boiled eggs, [place] bacon Lunch: Green beans, chicken; fast food Dinner: meat, vegetable Snacks: Candy, popcorn, yogurt Desserts: Daily Beverages: Water, flavored sports drink  Exercise: She is active through boot camp three days weekly, one Zumba class weekly.  Eye exam: Completed in March 2018 Dental exam: Completes semi-annually. Colonoscopy: Never completed.  Pap Smear: Completed in 2017, normal. Due in 2020 Mammogram: Due  Review of Systems  Constitutional: Negative for unexpected weight change.  HENT: Negative for rhinorrhea.   Respiratory: Negative for cough and shortness of breath.   Cardiovascular: Negative for chest pain.  Gastrointestinal: Negative for constipation and diarrhea.  Genitourinary: Negative for difficulty urinating and menstrual problem.  Musculoskeletal: Positive for arthralgias. Negative for myalgias.       Intermittent right knee pain  Skin: Negative for rash.  Allergic/Immunologic: Negative for environmental allergies.  Neurological: Negative for dizziness, numbness and headaches.       Past Medical History:  Diagnosis Date  . Chicken pox   . Essential hypertension   . GERD (gastroesophageal reflux disease)   . Graves disease   . Hypothyroidism      Social History   Socioeconomic History  . Marital status: Married    Spouse name: Not on file  . Number of children: Not on file  . Years of  education: Not on file  . Highest education level: Not on file  Social Needs  . Financial resource strain: Not on file  . Food insecurity - worry: Not on file  . Food insecurity - inability: Not on file  . Transportation needs - medical: Not on file  . Transportation needs - non-medical: Not on file  Occupational History  . Not on file  Tobacco Use  . Smoking status: Never Smoker  . Smokeless tobacco: Never Used  Substance and Sexual Activity  . Alcohol use: Yes    Alcohol/week: 0.0 oz    Comment: occas  . Drug use: No  . Sexual activity: Not on file  Other Topics Concern  . Not on file  Social History Narrative   Married.   2 children.   Works as a Child psychotherapist.   Enjoys exercising, going to the movies, shopping.     Past Surgical History:  Procedure Laterality Date  . EYE SURGERY Right 2013  . TUBAL LIGATION      Family History  Problem Relation Age of Onset  . Alcohol abuse Father   . Hypertension Father   . Cirrhosis Father   . Hypertension Mother   . Colon cancer Neg Hx     Allergies  Allergen Reactions  . Cefuroxime Axetil     REACTION: swelling  . Norvasc [Amlodipine Besylate] Swelling  . Sulfa Antibiotics     Current Outpatient Medications on File Prior to Visit  Medication Sig Dispense Refill  . cholecalciferol (VITAMIN D) 1000 units tablet Take 1,000 Units by mouth daily.    Marland Kitchen  loratadine (CLARITIN) 10 MG tablet Take 10 mg by mouth daily.    Marland Kitchen. losartan-hydrochlorothiazide (HYZAAR) 50-12.5 MG tablet Take 1 tablet by mouth daily.     No current facility-administered medications on file prior to visit.     BP 124/70   Pulse 71   Temp 98.1 F (36.7 C) (Oral)   Ht 5' 4.5" (1.638 m)   Wt 258 lb 6.4 oz (117.2 kg)   LMP  (LMP Unknown)   SpO2 97%   BMI 43.67 kg/m    Objective:   Physical Exam  Constitutional: She is oriented to person, place, and time. She appears well-nourished.  HENT:  Right Ear: Tympanic membrane and ear canal normal.    Left Ear: Tympanic membrane and ear canal normal.  Nose: Nose normal.  Mouth/Throat: Oropharynx is clear and moist.  Eyes: Conjunctivae and EOM are normal. Pupils are equal, round, and reactive to light.  Neck: Neck supple. No thyromegaly present.  Cardiovascular: Normal rate and regular rhythm.  No murmur heard. Pulmonary/Chest: Effort normal and breath sounds normal. She has no rales.  Abdominal: Soft. Bowel sounds are normal. There is no tenderness.  Musculoskeletal: Normal range of motion.  Lymphadenopathy:    She has no cervical adenopathy.  Neurological: She is alert and oriented to person, place, and time. She has normal reflexes. No cranial nerve deficit.  Skin: Skin is warm and dry. No rash noted.  Psychiatric: She has a normal mood and affect.          Assessment & Plan:

## 2017-04-17 NOTE — Assessment & Plan Note (Signed)
Immunizations UTD. Mammogram due, pending. Colonoscopy due, referral placed. Pap smear UTD, due in 2020. Discussed the importance of a healthy diet and regular exercise in order for weight loss, and to reduce the risk of any potential medical problems. Exam unremarkable. Labs overall stable except for TSH, will repeat after medication adjustment. Follow up in 1 year.

## 2017-04-17 NOTE — Assessment & Plan Note (Signed)
Recent A1C of 6.0 which is about that same in years prior. Continue regular exercise, continue to work on diet with weight watchers. Continue to monitor, repeat in 6 months.

## 2017-04-17 NOTE — Assessment & Plan Note (Signed)
Discussed the importance of a healthy diet and regular exercise in order for weight loss, and to reduce the risk of any potential medical problems.  

## 2017-05-15 ENCOUNTER — Ambulatory Visit: Payer: Federal, State, Local not specified - PPO | Admitting: Primary Care

## 2017-05-15 VITALS — BP 122/72 | HR 75 | Temp 98.2°F | Ht 64.5 in | Wt 252.5 lb

## 2017-05-15 DIAGNOSIS — M25561 Pain in right knee: Secondary | ICD-10-CM

## 2017-05-15 NOTE — Progress Notes (Signed)
Subjective:    Patient ID: Vanessa Gardner, female    DOB: 02-May-1965, 52 y.o.   MRN: [id number]  HPI  Vanessa Gardner is a 52 year old female with a history of obesity who presents today with a chief complaint of knee pain. Her pain is located to the right knee posteriorly with radiation around to the anterior. Pain has been present since January 2019.   She's been active in boot camp for which she started around the time of her knee pain. She participates 6 weeks at a time. She's been out of boot camp for the past one week and continued to notice pain, although today is doing better. She's also still active in [employer], participated in several classes last week, which did not bother her knee. She's returning to boot camp tonight. She's tried taken Aleve, Tumeric with some improvement. She's tried to find knee braces but can't find one that will fit.  She denies injury, weakness, swelling.   Review of Systems  Musculoskeletal: Negative for joint swelling.       Right knee pain  Skin: Negative for color change.  Neurological: Negative for weakness.       Past Medical History:  Diagnosis Date  . Chicken pox   . Essential hypertension   . GERD (gastroesophageal reflux disease)   . Graves disease   . Hypothyroidism      Social History   Socioeconomic History  . Marital status: Married    Spouse name: Not on file  . Number of children: Not on file  . Years of education: Not on file  . Highest education level: Not on file  Social Needs  . Financial resource strain: Not on file  . Food insecurity - worry: Not on file  . Food insecurity - inability: Not on file  . Transportation needs - medical: Not on file  . Transportation needs - non-medical: Not on file  Occupational History  . Not on file  Tobacco Use  . Smoking status: Never Smoker  . Smokeless tobacco: Never Used  Substance and Sexual Activity  . Alcohol use: Yes    Alcohol/week: 0.0 oz    Comment: occas  . Drug use: No  .  Sexual activity: Not on file  Other Topics Concern  . Not on file  Social History Narrative   Married.   2 children.   Works as a Child psychotherapist.   Enjoys exercising, going to the movies, shopping.     Past Surgical History:  Procedure Laterality Date  . EYE SURGERY Right 2013  . TUBAL LIGATION      Family History  Problem Relation Age of Onset  . Alcohol abuse Father   . Hypertension Father   . Cirrhosis Father   . Hypertension Mother   . Colon cancer Neg Hx     Allergies  Allergen Reactions  . Cefuroxime Axetil     REACTION: swelling  . Norvasc [Amlodipine Besylate] Swelling  . Sulfa Antibiotics     Current Outpatient Medications on File Prior to Visit  Medication Sig Dispense Refill  . cholecalciferol (VITAMIN D) 1000 units tablet Take 1,000 Units by mouth daily.    Marland Kitchen levothyroxine (SYNTHROID, LEVOTHROID) 125 MCG tablet Take 1 tablet (125 mcg total) by mouth daily. 30 tablet 1  . loratadine (CLARITIN) 10 MG tablet Take 10 mg by mouth daily.    Marland Kitchen losartan-hydrochlorothiazide (HYZAAR) 50-12.5 MG tablet Take 1 tablet by mouth daily.     No current  facility-administered medications on file prior to visit.     BP 122/72   Pulse 75   Temp 98.2 F (36.8 C) (Oral)   Ht 5' 4.5" (1.638 m)   Wt 252 lb 8 oz (114.5 kg)   LMP  (LMP Unknown)   SpO2 97%   BMI 42.67 kg/m    Objective:   Physical Exam  Constitutional: She appears well-nourished.  Neck: Neck supple.  Cardiovascular: Normal rate.  Pulmonary/Chest: Effort normal.  Musculoskeletal:       Right knee: She exhibits normal range of motion and no swelling. No tenderness found.  5/5 strength to bilateral lower extremities  Skin: Skin is warm and dry. No erythema.          Assessment & Plan:  Acute Knee Pain:  Located to right knee since January 2019, overall improved today. Suspect improvement is secondary to rest from vigorous boot camp, discussed this with patient and cautioned about returning for  this 6 week session. Continue to remain active in order for weight loss, discussed this as well. Discussed to find knee brace at home supply store, [facility]. Use Aleve prior to boot camp and afterward if needed. She'll update if knee pain persists, consider PT at that time.  Doreene NestKatherine K Clark, NP

## 2017-05-15 NOTE — Patient Instructions (Signed)
Find a knee brace as discussed. [facility], [facility], [facility].   Try taking Aleve prior to boot camp participation. Consider resting during this 6 week period if pain returns.   Try water aerobics or increase Zumba for exercise.  Please update me if pain persists despite rest and the knee brace.  It was a pleasure to see you today!   Knee Exercises Ask your health care provider which exercises are safe for you. Do exercises exactly as told by your health care provider and adjust them as directed. It is normal to feel mild stretching, pulling, tightness, or discomfort as you do these exercises, but you should stop right away if you feel sudden pain or your pain gets worse.Do not begin these exercises until told by your health care provider. STRETCHING AND RANGE OF MOTION EXERCISES These exercises warm up your muscles and joints and improve the movement and flexibility of your knee. These exercises also help to relieve pain, numbness, and tingling. Exercise A: Knee Extension, Prone 1. Lie on your abdomen on a bed. 2. Place your left / right knee just beyond the edge of the surface so your knee is not on the bed. You can put a towel under your left / right thigh just above your knee for comfort. 3. Relax your leg muscles and allow gravity to straighten your knee. You should feel a stretch behind your left / right knee. 4. Hold this position for __________ seconds. 5. Scoot up so your knee is supported between repetitions. Repeat __________ times. Complete this stretch __________ times a day. Exercise B: Knee Flexion, Active  1. Lie on your back with both knees straight. If this causes back discomfort, bend your left / right knee so your foot is flat on the floor. 2. Slowly slide your left / right heel back toward your buttocks until you feel a gentle stretch in the front of your knee or thigh. 3. Hold this position for __________ seconds. 4. Slowly slide your left / right heel back to  the starting position. Repeat __________ times. Complete this exercise __________ times a day. Exercise C: Quadriceps, Prone  1. Lie on your abdomen on a firm surface, such as a bed or padded floor. 2. Bend your left / right knee and hold your ankle. If you cannot reach your ankle or pant leg, loop a belt around your foot and grab the belt instead. 3. Gently pull your heel toward your buttocks. Your knee should not slide out to the side. You should feel a stretch in the front of your thigh and knee. 4. Hold this position for __________ seconds. Repeat __________ times. Complete this stretch __________ times a day. Exercise D: Hamstring, Supine 1. Lie on your back. 2. Loop a belt or towel over the ball of your left / right foot. The ball of your foot is on the walking surface, right under your toes. 3. Straighten your left / right knee and slowly pull on the belt to raise your leg until you feel a gentle stretch behind your knee. ? Do not let your left / right knee bend while you do this. ? Keep your other leg flat on the floor. 4. Hold this position for __________ seconds. Repeat __________ times. Complete this stretch __________ times a day. STRENGTHENING EXERCISES These exercises build strength and endurance in your knee. Endurance is the ability to use your muscles for a long time, even after they get tired. Exercise E: Quadriceps, Isometric  1. Lorenz Coaster  on your back with your left / right leg extended and your other knee bent. Put a rolled towel or small pillow under your knee if told by your health care provider. 2. Slowly tense the muscles in the front of your left / right thigh. You should see your kneecap slide up toward your hip or see increased dimpling just above the knee. This motion will push the back of the knee toward the floor. 3. For __________ seconds, keep the muscle as tight as you can without increasing your pain. 4. Relax the muscles slowly and completely. Repeat __________  times. Complete this exercise __________ times a day. Exercise F: Straight Leg Raises - Quadriceps 1. Lie on your back with your left / right leg extended and your other knee bent. 2. Tense the muscles in the front of your left / right thigh. You should see your kneecap slide up or see increased dimpling just above the knee. Your thigh may even shake a bit. 3. Keep these muscles tight as you raise your leg 4-6 inches (10-15 cm) off the floor. Do not let your knee bend. 4. Hold this position for __________ seconds. 5. Keep these muscles tense as you lower your leg. 6. Relax your muscles slowly and completely after each repetition. Repeat __________ times. Complete this exercise __________ times a day. Exercise G: Hamstring, Isometric 1. Lie on your back on a firm surface. 2. Bend your left / right knee approximately __________ degrees. 3. Dig your left / right heel into the surface as if you are trying to pull it toward your buttocks. Tighten the muscles in the back of your thighs to dig as hard as you can without increasing any pain. 4. Hold this position for __________ seconds. 5. Release the tension gradually and allow your muscles to relax completely for __________ seconds after each repetition. Repeat __________ times. Complete this exercise __________ times a day. Exercise H: Hamstring Curls  If told by your health care provider, do this exercise while wearing ankle weights. Begin with __________ weights. Then increase the weight by 1 lb (0.5 kg) increments. Do not wear ankle weights that are more than __________. 1. Lie on your abdomen with your legs straight. 2. Bend your left / right knee as far as you can without feeling pain. Keep your hips flat against the floor. 3. Hold this position for __________ seconds. 4. Slowly lower your leg to the starting position.  Repeat __________ times. Complete this exercise __________ times a day. Exercise I: Squats (Quadriceps) 1. Stand in front  of a table, with your feet and knees pointing straight ahead. You may rest your hands on the table for balance but not for support. 2. Slowly bend your knees and lower your hips like you are going to sit in a chair. ? Keep your weight over your heels, not over your toes. ? Keep your lower legs upright so they are parallel with the table legs. ? Do not let your hips go lower than your knees. ? Do not bend lower than told by your health care provider. ? If your knee pain increases, do not bend as low. 3. Hold the squat position for __________ seconds. 4. Slowly push with your legs to return to standing. Do not use your hands to pull yourself to standing. Repeat __________ times. Complete this exercise __________ times a day. Exercise J: Wall Slides (Quadriceps)  1. Lean your back against a smooth wall or door while you walk your feet out  18-24 inches (46-61 cm) from it. 2. Place your feet hip-width apart. 3. Slowly slide down the wall or door until your knees bend __________ degrees. Keep your knees over your heels, not over your toes. Keep your knees in line with your hips. 4. Hold for __________ seconds. Repeat __________ times. Complete this exercise __________ times a day. Exercise K: Straight Leg Raises - Hip Abductors 1. Lie on your side with your left / right leg in the top position. Lie so your head, shoulder, knee, and hip line up. You may bend your bottom knee to help you keep your balance. 2. Roll your hips slightly forward so your hips are stacked directly over each other and your left / right knee is facing forward. 3. Leading with your heel, lift your top leg 4-6 inches (10-15 cm). You should feel the muscles in your outer hip lifting. ? Do not let your foot drift forward. ? Do not let your knee roll toward the ceiling. 4. Hold this position for __________ seconds. 5. Slowly return your leg to the starting position. 6. Let your muscles relax completely after each  repetition. Repeat __________ times. Complete this exercise __________ times a day. Exercise L: Straight Leg Raises - Hip Extensors 1. Lie on your abdomen on a firm surface. You can put a pillow under your hips if that is more comfortable. 2. Tense the muscles in your buttocks and lift your left / right leg about 4-6 inches (10-15 cm). Keep your knee straight as you lift your leg. 3. Hold this position for __________ seconds. 4. Slowly lower your leg to the starting position. 5. Let your leg relax completely after each repetition. Repeat __________ times. Complete this exercise __________ times a day. This information is not intended to replace advice given to you by your health care provider. Make sure you discuss any questions you have with your health care provider. Document Released: 01/19/2005 Document Revised: 11/30/2015 Document Reviewed: 01/11/2015 Elsevier Interactive Patient Education  2018 [employer]

## 2017-05-16 ENCOUNTER — Encounter: Payer: Self-pay | Admitting: Gastroenterology

## 2017-05-29 ENCOUNTER — Other Ambulatory Visit (INDEPENDENT_AMBULATORY_CARE_PROVIDER_SITE_OTHER): Payer: Federal, State, Local not specified - PPO

## 2017-05-29 DIAGNOSIS — E039 Hypothyroidism, unspecified: Secondary | ICD-10-CM

## 2017-05-29 DIAGNOSIS — E876 Hypokalemia: Secondary | ICD-10-CM

## 2017-05-29 LAB — POTASSIUM: POTASSIUM: 3.6 meq/L (ref 3.5–5.1)

## 2017-05-29 LAB — TSH: TSH: 1.36 u[IU]/mL (ref 0.35–4.50)

## 2017-06-13 ENCOUNTER — Other Ambulatory Visit: Payer: Self-pay | Admitting: Primary Care

## 2017-06-13 DIAGNOSIS — E039 Hypothyroidism, unspecified: Secondary | ICD-10-CM

## 2017-06-26 ENCOUNTER — Telehealth: Payer: Self-pay | Admitting: Primary Care

## 2017-06-26 DIAGNOSIS — I10 Essential (primary) hypertension: Secondary | ICD-10-CM

## 2017-06-27 NOTE — Telephone Encounter (Signed)
Ok to refill? Electronically refill request for losartan-hydrochlorothiazide (HYZAAR) 50-12.5 MG tablet  Rx has not been prescribed by Vanessa Gardner. Last seen on 04/17/2017.

## 2017-06-27 NOTE — Telephone Encounter (Signed)
Has she been taking her losartan-HCTZ for a while? Any period of time where she stopped taking?

## 2017-06-28 NOTE — Telephone Encounter (Signed)
Spoken and notified patient of Vanessa Gardner's comments. Patient stated that she had enough refills from her previous primary care provider until now.

## 2017-06-28 NOTE — Telephone Encounter (Signed)
Noted, refills sent to pharmacy. 

## 2017-07-03 MED ORDER — HYDROCHLOROTHIAZIDE 12.5 MG PO TABS: 12.5000 mg | ORAL_TABLET | Freq: Every day | ORAL | 0 refills | Status: DC

## 2017-07-03 MED ORDER — LOSARTAN POTASSIUM 50 MG PO TABS: 50.0000 mg | ORAL_TABLET | Freq: Every day | ORAL | 0 refills | Status: DC

## 2017-07-03 NOTE — Telephone Encounter (Signed)
Copied from CRM [id number]. Topic: Quick Communication - Rx Refill/Question >> Jul 03, 2017  1:34 PM Crist InfanteHarrald, Kathy J wrote: Medication: losartan-hydrochlorothiazide (HYZAAR) 50-12.5 MG tablet Pharmacy calling to advise this med is on backorder. Pharmacy requesting this be sent in 2 separate scripts:   Losartan  //30 day hydrochlorothiazide // 30 day  [facility] [id number] - [place], [place] - [address] [phone] (Phone) [phone] (Fax)

## 2017-07-03 NOTE — Telephone Encounter (Signed)
Noted, Rx's sent to pharmacy for 30 day supply as requested.

## 2017-07-10 ENCOUNTER — Other Ambulatory Visit: Payer: Self-pay

## 2017-07-10 ENCOUNTER — Ambulatory Visit (AMBULATORY_SURGERY_CENTER): Payer: Self-pay | Admitting: *Deleted

## 2017-07-10 VITALS — Ht 64.5 in | Wt 246.0 lb

## 2017-07-10 DIAGNOSIS — Z1211 Encounter for screening for malignant neoplasm of colon: Secondary | ICD-10-CM

## 2017-07-10 MED ORDER — NA SULFATE-K SULFATE-MG SULF 17.5-3.13-1.6 GM/177ML PO SOLN: ORAL | 0 refills | Status: DC

## 2017-07-10 NOTE — Progress Notes (Signed)
Patient denies any allergies to eggs or soy. Patient denies any problems with anesthesia/sedation. Patient denies any oxygen use at home. Patient denies taking any diet/weight loss medications or blood thinners. EMMI education assisgned to patient on colonoscopy, this was explained and instructions given to patient. 

## 2017-07-13 ENCOUNTER — Encounter: Payer: Self-pay | Admitting: Gastroenterology

## 2017-07-24 ENCOUNTER — Ambulatory Visit (AMBULATORY_SURGERY_CENTER): Payer: Federal, State, Local not specified - PPO | Admitting: Gastroenterology

## 2017-07-24 ENCOUNTER — Encounter: Payer: Self-pay | Admitting: Gastroenterology

## 2017-07-24 ENCOUNTER — Other Ambulatory Visit: Payer: Self-pay

## 2017-07-24 VITALS — BP 127/54 | HR 52 | Temp 97.5°F | Resp 18 | Ht 64.0 in | Wt 246.0 lb

## 2017-07-24 DIAGNOSIS — Z1212 Encounter for screening for malignant neoplasm of rectum: Secondary | ICD-10-CM

## 2017-07-24 DIAGNOSIS — Z1211 Encounter for screening for malignant neoplasm of colon: Secondary | ICD-10-CM

## 2017-07-24 MED ORDER — SODIUM CHLORIDE 0.9 % IV SOLN: 500.0000 mL | Freq: Once | INTRAVENOUS | Status: DC

## 2017-07-24 NOTE — Progress Notes (Signed)
Pt. Reports no change in her medical or surgical history since her pre-visit 07/10/2017. 

## 2017-07-24 NOTE — Op Note (Signed)
[facility] Patient Name: Vanessa Gardner Procedure Date: 07/24/2017 11:14 AM MRN: [id number] Endoscopist: Viviann Spare P. Armbruster MD, MD Age: 52 Referring MD:  Date of Birth: February 09, 1966 Gender: Female Account #: [id number] Procedure:                Colonoscopy Indications:              Screening for colorectal malignant neoplasm, This                            is the patient's first colonoscopy Medicines:                Monitored Anesthesia Care Procedure:                Pre-Anesthesia Assessment:                           - Prior to the procedure, a History and Physical                            was performed, and patient medications and                            allergies were reviewed. The patient's tolerance of                            previous anesthesia was also reviewed. The risks                            and benefits of the procedure and the sedation                            options and risks were discussed with the patient.                            All questions were answered, and informed consent                            was obtained. Prior Anticoagulants: The patient has                            taken no previous anticoagulant or antiplatelet                            agents. ASA Grade Assessment: II - A patient with                            mild systemic disease. After reviewing the risks                            and benefits, the patient was deemed in                            satisfactory condition to undergo the procedure.  After obtaining informed consent, the colonoscope                            was passed under direct vision. Throughout the                            procedure, the patient's blood pressure, pulse, and                            oxygen saturations were monitored continuously. The                            Colonoscope was introduced through the anus and                            advanced to the the  cecum, identified by                            appendiceal orifice and ileocecal valve. The                            colonoscopy was performed without difficulty. The                            patient tolerated the procedure well. The quality                            of the bowel preparation was good. The ileocecal                            valve, appendiceal orifice, and rectum were                            photographed. Scope In: 11:19:55 AM Scope Out: 11:33:45 AM Scope Withdrawal Time: 0 hours 10 minutes 18 seconds  Total Procedure Duration: 0 hours 13 minutes 50 seconds  Findings:                 The perianal and digital rectal examinations were                            normal.                           The entire examined colon appeared normal on direct                            and retroflexion views. No polyps. Complications:            No immediate complications. Estimated blood loss:                            None. Estimated Blood Loss:     Estimated blood loss: none. Impression:               - The entire examined colon is normal on direct and  retroflexion views.                           - No polyps Recommendation:           - Patient has a contact number available for                            emergencies. The signs and symptoms of potential                            delayed complications were discussed with the                            patient. Return to normal activities tomorrow.                            Written discharge instructions were provided to the                            patient.                           - Resume previous diet.                           - Continue present medications.                           - Repeat colonoscopy in 10 years for screening                            purposes. Viviann Spare P. Armbruster MD, MD 07/24/2017 11:36:09 AM This report has been signed electronically.

## 2017-07-24 NOTE — Patient Instructions (Signed)
**   Normal Colonoscopy **   YOU HAD AN ENDOSCOPIC PROCEDURE TODAY AT THE [facility]:   Refer to the procedure report that was given to you for any specific questions about what was found during the examination.  If the procedure report does not answer your questions, please call your gastroenterologist to clarify.  If you requested that your care partner not be given the details of your procedure findings, then the procedure report has been included in a sealed envelope for you to review at your convenience later.  YOU SHOULD EXPECT: Some feelings of bloating in the abdomen. Passage of more gas than usual.  Walking can help get rid of the air that was put into your GI tract during the procedure and reduce the bloating. If you had a lower endoscopy (such as a colonoscopy or flexible sigmoidoscopy) you may notice spotting of blood in your stool or on the toilet paper. If you underwent a bowel prep for your procedure, you may not have a normal bowel movement for a few days.  Please Note:  You might notice some irritation and congestion in your nose or some drainage.  This is from the oxygen used during your procedure.  There is no need for concern and it should clear up in a day or so.  SYMPTOMS TO REPORT IMMEDIATELY:   Following lower endoscopy (colonoscopy or flexible sigmoidoscopy):  Excessive amounts of blood in the stool  Significant tenderness or worsening of abdominal pains  Swelling of the abdomen that is new, acute  Fever of 100F or higher  For urgent or emergent issues, a gastroenterologist can be reached at any hour by calling [phone].   DIET:  We do recommend a small meal at first, but then you may proceed to your regular diet.  Drink plenty of fluids but you should avoid alcoholic beverages for 24 hours.  ACTIVITY:  You should plan to take it easy for the rest of today and you should NOT DRIVE or use heavy machinery until tomorrow (because of the sedation  medicines used during the test).    FOLLOW UP: Our staff will call the number listed on your records the next business day following your procedure to check on you and address any questions or concerns that you may have regarding the information given to you following your procedure. If we do not reach you, we will leave a message.  However, if you are feeling well and you are not experiencing any problems, there is no need to return our call.  We will assume that you have returned to your regular daily activities without incident.  If any biopsies were taken you will be contacted by phone or by letter within the next 1-3 weeks.  Please call us at [phone] if you have not heard about the biopsies in 3 weeks.    SIGNATURES/CONFIDENTIALITY: You and/or your care partner have signed paperwork which will be entered into your electronic medical record.  These signatures attest to the fact that that the information above on your After Visit Summary has been reviewed and is understood.  Full responsibility of the confidentiality of this discharge information lies with you and/or your care-partner. 

## 2017-07-24 NOTE — Progress Notes (Signed)
To PACU, VSS. Report to [email or web address] 

## 2017-07-25 ENCOUNTER — Telehealth: Payer: Self-pay | Admitting: *Deleted

## 2017-07-25 NOTE — Telephone Encounter (Signed)
  Follow up Call-  Call back number 07/24/2017  Post procedure Call Back phone  # [phone]  Permission to leave phone message Yes  Some recent data might be hidden     Patient questions:  Do you have a fever, pain , or abdominal swelling? No. Pain Score  0 *  Have you tolerated food without any problems? Yes.    Have you been able to return to your normal activities? Yes.    Do you have any questions about your discharge instructions: Diet   No. Medications  No. Follow up visit  No.  Do you have questions or concerns about your Care? No.  Actions: * If pain score is 4 or above: No action needed, pain <4.

## 2017-08-03 ENCOUNTER — Other Ambulatory Visit: Payer: Self-pay | Admitting: Primary Care

## 2017-08-03 DIAGNOSIS — I10 Essential (primary) hypertension: Secondary | ICD-10-CM

## 2017-08-06 ENCOUNTER — Other Ambulatory Visit: Payer: Self-pay | Admitting: Primary Care

## 2017-08-06 DIAGNOSIS — I10 Essential (primary) hypertension: Secondary | ICD-10-CM

## 2017-09-04 ENCOUNTER — Other Ambulatory Visit: Payer: Self-pay | Admitting: Primary Care

## 2017-09-04 DIAGNOSIS — Z1231 Encounter for screening mammogram for malignant neoplasm of breast: Secondary | ICD-10-CM

## 2017-09-11 ENCOUNTER — Other Ambulatory Visit: Payer: Self-pay | Admitting: Primary Care

## 2017-09-11 DIAGNOSIS — E039 Hypothyroidism, unspecified: Secondary | ICD-10-CM

## 2017-09-11 DIAGNOSIS — I10 Essential (primary) hypertension: Secondary | ICD-10-CM

## 2017-09-12 MED ORDER — SYNTHROID 125 MCG PO TABS: 125.0000 ug | ORAL_TABLET | Freq: Every day | ORAL | 1 refills | Status: DC

## 2017-09-12 MED ORDER — LOSARTAN POTASSIUM-HCTZ 50-12.5 MG PO TABS: 1.0000 | ORAL_TABLET | Freq: Every day | ORAL | 1 refills | Status: DC

## 2017-09-12 NOTE — Telephone Encounter (Signed)
Spoken to patient and she would like refill of Hyzaar instead.  Will send Hyzaar for patient request.  Also patient request to refill Synthroid since she is change insurance.

## 2017-09-18 ENCOUNTER — Ambulatory Visit
Admission: RE | Admit: 2017-09-18 | Discharge: 2017-09-18 | Disposition: A | Discharge status: Home / Self Care | Payer: Managed Care, Other (non HMO) | Source: Ambulatory Visit | Attending: Primary Care | Admitting: Primary Care

## 2017-09-18 DIAGNOSIS — Z1231 Encounter for screening mammogram for malignant neoplasm of breast: Secondary | ICD-10-CM | POA: Diagnosis present

## 2017-10-11 ENCOUNTER — Other Ambulatory Visit: Payer: Self-pay | Admitting: Primary Care

## 2017-10-11 DIAGNOSIS — R7303 Prediabetes: Secondary | ICD-10-CM

## 2017-10-16 ENCOUNTER — Other Ambulatory Visit (INDEPENDENT_AMBULATORY_CARE_PROVIDER_SITE_OTHER): Payer: Managed Care, Other (non HMO)

## 2017-10-16 DIAGNOSIS — R7303 Prediabetes: Secondary | ICD-10-CM | POA: Diagnosis not present

## 2017-10-16 LAB — POCT GLYCOSYLATED HEMOGLOBIN (HGB A1C): Hemoglobin A1C: 5.3 % (ref 4.0–5.6)

## 2018-02-09 ENCOUNTER — Encounter: Payer: Self-pay | Admitting: Gastroenterology

## 2018-03-08 ENCOUNTER — Other Ambulatory Visit: Payer: Self-pay | Admitting: Primary Care

## 2018-03-31 ENCOUNTER — Other Ambulatory Visit: Payer: Self-pay | Admitting: Primary Care

## 2018-03-31 DIAGNOSIS — E039 Hypothyroidism, unspecified: Secondary | ICD-10-CM

## 2018-04-10 ENCOUNTER — Other Ambulatory Visit: Payer: Self-pay | Admitting: Primary Care

## 2018-04-10 DIAGNOSIS — R7303 Prediabetes: Secondary | ICD-10-CM

## 2018-04-10 DIAGNOSIS — E039 Hypothyroidism, unspecified: Secondary | ICD-10-CM

## 2018-04-16 ENCOUNTER — Other Ambulatory Visit (INDEPENDENT_AMBULATORY_CARE_PROVIDER_SITE_OTHER): Payer: Managed Care, Other (non HMO)

## 2018-04-16 DIAGNOSIS — R7303 Prediabetes: Secondary | ICD-10-CM

## 2018-04-16 DIAGNOSIS — E039 Hypothyroidism, unspecified: Secondary | ICD-10-CM | POA: Diagnosis not present

## 2018-04-16 LAB — COMPREHENSIVE METABOLIC PANEL
ALT: 13 U/L (ref 0–35)
AST: 21 U/L (ref 0–37)
Albumin: 4 g/dL (ref 3.5–5.2)
Alkaline Phosphatase: 78 U/L (ref 39–117)
BUN: 14 mg/dL (ref 6–23)
CALCIUM: 9.4 mg/dL (ref 8.4–10.5)
CO2: 31 mEq/L (ref 19–32)
Chloride: 102 mEq/L (ref 96–112)
Creatinine, Ser: 0.9 mg/dL (ref 0.40–1.20)
GFR: 79.01 mL/min (ref >60.00)
Glucose, Bld: 94 mg/dL (ref 70–99)
Potassium: 3.7 mEq/L (ref 3.5–5.1)
Sodium: 139 mEq/L (ref 135–145)
Total Bilirubin: 0.4 mg/dL (ref 0.2–1.2)
Total Protein: 7.3 g/dL (ref 6.0–8.3)

## 2018-04-16 LAB — LIPID PANEL
Cholesterol: 180 mg/dL (ref 0–200)
HDL: 52.4 mg/dL (ref >39.00)
LDL Cholesterol: 113 mg/dL — ABNORMAL HIGH (ref 0–99)
NonHDL: 127.72
Total CHOL/HDL Ratio: 3
Triglycerides: 75 mg/dL (ref 0.0–149.0)
VLDL: 15 mg/dL (ref 0.0–40.0)

## 2018-04-16 LAB — TSH: TSH: 0.57 u[IU]/mL (ref 0.35–4.50)

## 2018-04-16 LAB — HEMOGLOBIN A1C: Hgb A1c MFr Bld: 5.6 % (ref 4.6–6.5)

## 2018-04-18 ENCOUNTER — Encounter: Payer: Self-pay | Admitting: Primary Care

## 2018-04-18 ENCOUNTER — Ambulatory Visit (INDEPENDENT_AMBULATORY_CARE_PROVIDER_SITE_OTHER): Payer: Managed Care, Other (non HMO) | Admitting: Primary Care

## 2018-04-18 VITALS — BP 120/74 | HR 60 | Temp 98.3°F | Ht 64.0 in | Wt 238.8 lb

## 2018-04-18 DIAGNOSIS — E669 Obesity, unspecified: Secondary | ICD-10-CM | POA: Diagnosis not present

## 2018-04-18 DIAGNOSIS — E039 Hypothyroidism, unspecified: Secondary | ICD-10-CM

## 2018-04-18 DIAGNOSIS — Z0000 Encounter for general adult medical examination without abnormal findings: Secondary | ICD-10-CM | POA: Diagnosis not present

## 2018-04-18 DIAGNOSIS — R7303 Prediabetes: Secondary | ICD-10-CM

## 2018-04-18 DIAGNOSIS — K219 Gastro-esophageal reflux disease without esophagitis: Secondary | ICD-10-CM

## 2018-04-18 NOTE — Patient Instructions (Signed)
Continue exercising. You should be getting 150 minutes of moderate intensity exercise weekly.  Continue to work on your diet, make sure to eat plenty of vegetables, fruit, whole grains, lean protein.  Ensure you are consuming 64 ounces of water daily.  Remember to take your levothyroxine every morning with water only. No food or other medications for 30 minutes. Separate your multivitamins, heartburn medication four hours.   Schedule a visit for your pap smear as you are due.  It was a pleasure to see you today!   Preventive Care 40-64 Years, Female Preventive care refers to lifestyle choices and visits with your health care provider that can promote health and wellness. What does preventive care include?   A yearly physical exam. This is also called an annual well check.  Dental exams once or twice a year.  Routine eye exams. Ask your health care provider how often you should have your eyes checked.  Personal lifestyle choices, including: ? Daily care of your teeth and gums. ? Regular physical activity. ? Eating a healthy diet. ? Avoiding tobacco and drug use. ? Limiting alcohol use. ? Practicing safe sex. ? Taking low-dose aspirin daily starting at age 18. ? Taking vitamin and mineral supplements as recommended by your health care provider. What happens during an annual well check? The services and screenings done by your health care provider during your annual well check will depend on your age, overall health, lifestyle risk factors, and family history of disease. Counseling Your health care provider may ask you questions about your:  Alcohol use.  Tobacco use.  Drug use.  Emotional well-being.  Home and relationship well-being.  Sexual activity.  Eating habits.  Work and work Statistician.  Method of birth control.  Menstrual cycle.  Pregnancy history. Screening You may have the following tests or measurements:  Height, weight, and BMI.  Blood  pressure.  Lipid and cholesterol levels. These may be checked every 5 years, or more frequently if you are over 78 years old.  Skin check.  Lung cancer screening. You may have this screening every year starting at age 69 if you have a 30-pack-year history of smoking and currently smoke or have quit within the past 15 years.  Colorectal cancer screening. All adults should have this screening starting at age 17 and continuing until age 54. Your health care provider may recommend screening at age 52. You will have tests every 1-10 years, depending on your results and the type of screening test. People at increased risk should start screening at an earlier age. Screening tests may include: ? Guaiac-based fecal occult blood testing. ? Fecal immunochemical test (FIT). ? Stool DNA test. ? Virtual colonoscopy. ? Sigmoidoscopy. During this test, a flexible tube with a tiny camera (sigmoidoscope) is used to examine your rectum and lower colon. The sigmoidoscope is inserted through your anus into your rectum and lower colon. ? Colonoscopy. During this test, a long, thin, flexible tube with a tiny camera (colonoscope) is used to examine your entire colon and rectum.  Hepatitis C blood test.  Hepatitis B blood test.  Sexually transmitted disease (STD) testing.  Diabetes screening. This is done by checking your blood sugar (glucose) after you have not eaten for a while (fasting). You may have this done every 1-3 years.  Mammogram. This may be done every 1-2 years. Talk to your health care provider about when you should start having regular mammograms. This may depend on whether you have a family history of breast  cancer.  BRCA-related cancer screening. This may be done if you have a family history of breast, ovarian, tubal, or peritoneal cancers.  Pelvic exam and Pap test. This may be done every 3 years starting at age 59. Starting at age 83, this may be done every 5 years if you have a Pap test in  combination with an HPV test.  Bone density scan. This is done to screen for osteoporosis. You may have this scan if you are at [place] risk for osteoporosis. Discuss your test results, treatment options, and if necessary, the need for more tests with your health care provider. Vaccines Your health care provider may recommend certain vaccines, such as:  Influenza vaccine. This is recommended every year.  Tetanus, diphtheria, and acellular pertussis (Tdap, Td) vaccine. You may need a Td booster every 10 years.  Varicella vaccine. You may need this if you have not been vaccinated.  Zoster vaccine. You may need this after age 75.  Measles, mumps, and rubella (MMR) vaccine. You may need at least one dose of MMR if you were born in 1957 or later. You may also need a second dose.  Pneumococcal 13-valent conjugate (PCV13) vaccine. You may need this if you have certain conditions and were not previously vaccinated.  Pneumococcal polysaccharide (PPSV23) vaccine. You may need one or two doses if you smoke cigarettes or if you have certain conditions.  Meningococcal vaccine. You may need this if you have certain conditions.  Hepatitis A vaccine. You may need this if you have certain conditions or if you travel or work in places where you may be exposed to hepatitis A.  Hepatitis B vaccine. You may need this if you have certain conditions or if you travel or work in places where you may be exposed to hepatitis B.  Haemophilus influenzae type b (Hib) vaccine. You may need this if you have certain conditions. Talk to your health care provider about which screenings and vaccines you need and how often you need them. This information is not intended to replace advice given to you by your health care provider. Make sure you discuss any questions you have with your health care provider. Document Released: 04/03/2015 Document Revised: 04/27/2017 Document Reviewed: 01/06/2015 Elsevier Interactive Patient  Education  2019 [employer].

## 2018-04-18 NOTE — Assessment & Plan Note (Signed)
Immunizations UTD. Pap smear due, she will return to schedule. Mammogram UTD, due in Summer 2020 Colonoscopy UTD, due in 2029. Exam unremarkable. Labs reviewed. Follow up in 1 year for CPE.

## 2018-04-18 NOTE — Assessment & Plan Note (Signed)
Commended her on resuming weight watchers and getting regular exercise.

## 2018-04-18 NOTE — Assessment & Plan Note (Signed)
A1C within normal range. Commended her on efforts towards a healthy diet and regular exercise. Continue to monitor.

## 2018-04-18 NOTE — Assessment & Plan Note (Signed)
No recent symptoms. Continue to monitor

## 2018-04-18 NOTE — Progress Notes (Signed)
Subjective:    Patient ID: Vanessa Gardner, female    DOB: 1965-09-20, 53 y.o.   MRN: [id number]  HPI  [name] is a 53 year old female who presents today for complete physical.  Immunizations: -Tetanus: Completed in 2017 -Influenza: Completed this season   Diet: She endorses a fair diet. She recently started weight watchers  Breakfast: Egg, [place]urkey bacon Lunch: Salad, protein, vegetables Dinner: Meat, vegetable, starch  Snacks: Peppermint candy, chips Desserts: Daily  Beverages: Coffee, water, occasional diet soda,    Exercise: She is active in boot camp in 6 week intervals, also with some exercise classes. She is exercising four days weekly.  Eye exam: Completed in 2019 Dental exam: Completes semi-annually  Colonoscopy: Completed in 2019, due in 10 years Pap Smear: Completed over 3 years, declines today and will schedule Mammogram: Completed in July 2019   BP Readings from Last 3 Encounters:  04/18/18 120/74  07/24/17 (!) 127/54  05/15/17 122/72     Review of Systems  Constitutional: Negative for unexpected weight change.  HENT: Negative for rhinorrhea.   Respiratory: Negative for cough and shortness of breath.   Cardiovascular: Negative for chest pain.  Gastrointestinal: Negative for constipation and diarrhea.  Genitourinary: Negative for difficulty urinating.  Musculoskeletal: Negative for arthralgias and myalgias.  Skin: Negative for rash.  Allergic/Immunologic: Negative for environmental allergies.  Neurological: Negative for dizziness, numbness and headaches.  Psychiatric/Behavioral: The patient is not nervous/anxious.        Past Medical History:  Diagnosis Date  . Allergy   . Chicken pox   . Essential hypertension   . GERD (gastroesophageal reflux disease)   . Graves disease   . Hypothyroidism      Social History   Socioeconomic History  . Marital status: Married    Spouse name: Not on file  . Number of children: Not on file  . Years of  education: Not on file  . Highest education level: Not on file  Occupational History  . Not on file  Social Needs  . Financial resource strain: Not on file  . Food insecurity:    Worry: Not on file    Inability: Not on file  . Transportation needs:    Medical: Not on file    Non-medical: Not on file  Tobacco Use  . Smoking status: Never Smoker  . Smokeless tobacco: Never Used  Substance and Sexual Activity  . Alcohol use: Yes    Comment: twice a month-wine  . Drug use: No  . Sexual activity: Not on file  Lifestyle  . Physical activity:    Days per week: Not on file    Minutes per session: Not on file  . Stress: Not on file  Relationships  . Social connections:    Talks on phone: Not on file    Gets together: Not on file    Attends religious service: Not on file    Active member of club or organization: Not on file    Attends meetings of clubs or organizations: Not on file    Relationship status: Not on file  . Intimate partner violence:    Fear of current or ex partner: Not on file    Emotionally abused: Not on file    Physically abused: Not on file    Forced sexual activity: Not on file  Other Topics Concern  . Not on file  Social History Narrative   ** Merged History Encounter **  Married. 2 children. 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 Enjoys exercising, going to the movies, shopping.     Past Surgical History:  Procedure Laterality Date  . EYE SURGERY Right 2013  . TUBAL LIGATION      Family History  Problem Relation Age of Onset  . Breast cancer Neg Hx   . Alcohol abuse Father   . Hypertension Father   . Cirrhosis Father   . Hypertension Mother   . Throat cancer Maternal Grandfather   . Colon cancer Neg Hx   . Stomach cancer Neg Hx   . Esophageal cancer Neg Hx     Allergies  Allergen Reactions  . Sulfa Antibiotics Anaphylaxis  . Cefuroxime Axetil     REACTION: swelling  . Norvasc [Amlodipine Besylate] Swelling    Current Outpatient  Medications on File Prior to Visit  Medication Sig Dispense Refill  . cholecalciferol (VITAMIN D) 1000 units tablet Take 1,000 Units by mouth daily.    Marland Kitchen. loratadine (CLARITIN) 10 MG tablet Take 10 mg by mouth daily.    Marland Kitchen. losartan-hydrochlorothiazide (HYZAAR) 50-12.5 MG tablet TAKE 1 TABLET BY MOUTH EVERY DAY 90 tablet 1  . SYNTHROID 125 MCG tablet TAKE 1 TABLET BY MOUTH EVERY DAY 90 tablet 0  . Turmeric 500 MG TABS Take by mouth daily.     Current Facility-Administered Medications on File Prior to Visit  Medication Dose Route Frequency Provider Last Rate Last Dose  . 0.9 %  sodium chloride infusion  500 mL Intravenous Once Armbruster, Willaim RayasSteven P, MD        BP 120/74   Pulse 60   Temp 98.3 F (36.8 C) (Oral)   Ht 5\' 4"  (1.626 m)   Wt 238 lb 12 oz (108.3 kg)   SpO2 98%   BMI 40.98 kg/m    Objective:   Physical Exam  Constitutional: She is oriented to person, place, and time. She appears well-nourished.  HENT:  Mouth/Throat: No oropharyngeal exudate.  Eyes: Pupils are equal, round, and reactive to light. EOM are normal.  Neck: Neck supple. No thyromegaly present.  Cardiovascular: Normal rate and regular rhythm.  Respiratory: Effort normal and breath sounds normal.  GI: Soft. Bowel sounds are normal. There is no abdominal tenderness.  Musculoskeletal: Normal range of motion.  Neurological: She is alert and oriented to person, place, and time.  Skin: Skin is warm and dry.  Psychiatric: She has a normal mood and affect.           Assessment & Plan:

## 2018-04-18 NOTE — Assessment & Plan Note (Signed)
She is taking her levothyroxine with her other medications. Discussed to take alone with water only, no food or other medications for 30 minutes. Separate from multivitamin four hours. TSH on recent labs unremarkable. Continue current levothyroxine dose.

## 2018-05-14 ENCOUNTER — Ambulatory Visit (INDEPENDENT_AMBULATORY_CARE_PROVIDER_SITE_OTHER): Payer: Managed Care, Other (non HMO) | Admitting: Primary Care

## 2018-05-14 ENCOUNTER — Encounter: Payer: Self-pay | Admitting: Primary Care

## 2018-05-14 ENCOUNTER — Other Ambulatory Visit (HOSPITAL_COMMUNITY)
Admission: RE | Admit: 2018-05-14 | Discharge: 2018-05-14 | Disposition: A | Discharge status: Home / Self Care | Payer: Managed Care, Other (non HMO) | Source: Ambulatory Visit | Attending: Primary Care | Admitting: Primary Care

## 2018-05-14 VITALS — BP 122/76 | HR 55 | Temp 98.0°F | Ht 64.0 in | Wt 238.0 lb

## 2018-05-14 DIAGNOSIS — Z124 Encounter for screening for malignant neoplasm of cervix: Secondary | ICD-10-CM | POA: Diagnosis not present

## 2018-05-14 NOTE — Progress Notes (Signed)
Subjective:    Patient ID: Vanessa Gardner, female    DOB: 01-06-1966, 53 y.o.   MRN: [id number]  HPI  Vanessa Gardner is a 53 year old female who presents today for pap smear only. She underwent CPE on 04/18/18.  She has no complaints today. Denies vaginal itching, discharge, breakthrough bleeding. Her menstrual cycles are irregular with her last cycle being December 5th, 2020. Prior to that her last menstrual cycle was in September 2019.  Review of Systems  Respiratory: Negative for shortness of breath.   Cardiovascular: Negative for chest Gardner.  Genitourinary: Negative for dysuria, hematuria, pelvic Gardner, vaginal bleeding and vaginal discharge.       Irregular menstrual cycle  Neurological: Negative for dizziness.       Past Medical History:  Diagnosis Date  . Allergy   . Chicken pox   . Essential hypertension   . GERD (gastroesophageal reflux disease)   . Graves disease   . Hypothyroidism      Social History   Socioeconomic History  . Marital status: Married    Spouse name: Not on file  . Number of children: Not on file  . Years of education: Not on file  . Highest education level: Not on file  Occupational History  . Not on file  Social Needs  . Financial resource strain: Not on file  . Food insecurity:    Worry: Not on file    Inability: Not on file  . Transportation needs:    Medical: Not on file    Non-medical: Not on file  Tobacco Use  . Smoking status: Never Smoker  . Smokeless tobacco: Never Used  Substance and Sexual Activity  . Alcohol use: Yes    Comment: twice a month-wine  . Drug use: No  . Sexual activity: Not on file  Lifestyle  . Physical activity:    Days per week: Not on file    Minutes per session: Not on file  . Stress: Not on file  Relationships  . Social connections:    Talks on phone: Not on file    Gets together: Not on file    Attends religious service: Not on file    Active member of club or organization: Not on file   Attends meetings of clubs or organizations: Not on file    Relationship status: Not on file  . Intimate partner violence:    Fear of current or ex partner: Not on file    Emotionally abused: Not on file    Physically abused: Not on file    Forced sexual activity: Not on file  Other Topics Concern  . Not on file  Social History Narrative   ** Merged History Encounter **       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 Enjoys exercising, going to the movies, shopping.     Past Surgical History:  Procedure Laterality Date  . EYE SURGERY Right 2013  . TUBAL LIGATION      Family History  Problem Relation Age of Onset  . Breast cancer Neg Hx   . Alcohol abuse Father   . Hypertension Father   . Cirrhosis Father   . Hypertension Mother   . Throat cancer Maternal Grandfather   . Colon cancer Neg Hx   . Stomach cancer Neg Hx   . Esophageal cancer Neg Hx     Allergies  Allergen Reactions  . Sulfa Antibiotics Anaphylaxis  . Cefuroxime Axetil  REACTION: swelling  . Norvasc [Amlodipine Besylate] Swelling    Current Outpatient Medications on File Prior to Visit  Medication Sig Dispense Refill  . cholecalciferol (VITAMIN D) 1000 units tablet Take 1,000 Units by mouth daily.    Marland Kitchen loratadine (CLARITIN) 10 MG tablet Take 10 mg by mouth daily.    Marland Kitchen losartan-hydrochlorothiazide (HYZAAR) 50-12.5 MG tablet TAKE 1 TABLET BY MOUTH EVERY DAY 90 tablet 1  . SYNTHROID 125 MCG tablet TAKE 1 TABLET BY MOUTH EVERY DAY 90 tablet 0  . Turmeric 500 MG TABS Take by mouth daily.     Current Facility-Administered Medications on File Prior to Visit  Medication Dose Route Frequency Provider Last Rate Last Dose  . 0.9 %  sodium chloride infusion  500 mL Intravenous Once Armbruster, Willaim Rayas, MD        BP 122/76   Pulse (!) 55   Temp 98 F (36.7 C) (Oral)   Ht 5\' 4"  (1.626 m)   Wt 238 lb (108 kg)   SpO2 99%   BMI 40.85 kg/m    Objective:   Physical Exam  Constitutional: She  appears well-nourished.  Neck: Neck supple.  Cardiovascular: Normal rate.  Respiratory: Effort normal.  Genitourinary: There is no tenderness or lesion on the right labia. There is no tenderness or lesion on the left labia. Cervix exhibits no discharge. Right adnexum displays no mass and no tenderness. Left adnexum displays no mass and no tenderness.    No vaginal discharge or erythema.  No erythema in the vagina.  Skin: Skin is warm and dry.           Assessment & Plan:

## 2018-05-14 NOTE — Patient Instructions (Signed)
We will be in touch once we receive your pap smear results.  It was a pleasure to see you today!  

## 2018-05-14 NOTE — Assessment & Plan Note (Signed)
Due for pap smear, completed today.  Exam unremarkable. Menstrual cycles irregular, sounds to be perimenopausal.  Await pap smear results.

## 2018-05-16 LAB — CYTOLOGY - PAP
Diagnosis: NEGATIVE
HPV: NOT DETECTED

## 2018-07-03 ENCOUNTER — Other Ambulatory Visit: Payer: Self-pay | Admitting: Primary Care

## 2018-07-03 DIAGNOSIS — E039 Hypothyroidism, unspecified: Secondary | ICD-10-CM

## 2018-09-05 ENCOUNTER — Other Ambulatory Visit: Payer: Self-pay | Admitting: Primary Care

## 2018-10-01 ENCOUNTER — Other Ambulatory Visit: Payer: Self-pay | Admitting: Primary Care

## 2018-10-01 DIAGNOSIS — Z1231 Encounter for screening mammogram for malignant neoplasm of breast: Secondary | ICD-10-CM

## 2018-10-09 ENCOUNTER — Encounter: Payer: Self-pay | Admitting: Physician Assistant

## 2018-10-09 ENCOUNTER — Telehealth: Payer: Managed Care, Other (non HMO) | Admitting: Physician Assistant

## 2018-10-09 DIAGNOSIS — Z20822 Contact with and (suspected) exposure to covid-19: Secondary | ICD-10-CM

## 2018-10-09 DIAGNOSIS — Z20828 Contact with and (suspected) exposure to other viral communicable diseases: Secondary | ICD-10-CM

## 2018-10-09 NOTE — Progress Notes (Signed)
E-Visit for Corona Virus Screening   Your current symptoms could be consistent with the coronavirus.  Many health care providers can now test patients at their office but not all are.  [facility] has multiple testing sites. For information on our COVID testing locations and hours go to [email or web address]  Please quarantine yourself while awaiting your test results.  We are enrolling you in our [facility] for [facility] . Daily you will receive a questionnaire within the [facility] website. Our COVID 19 response team willl be monitoriing your responses daily.  If the above website does not offer you testing since you don't have any symptoms. Please contact the local Health Department at [phone] for their assessment of your risk and possible testing.   COVID-19 is a respiratory illness with symptoms that are similar to the flu. Symptoms are typically mild to moderate, but there have been cases of severe illness and death due to the virus. The following symptoms may appear 2-14 days after exposure: . Fever . Cough . Shortness of breath or difficulty breathing . Chills . Repeated shaking with chills . Muscle pain . Headache . Sore throat . New loss of taste or smell . Fatigue . Congestion or runny nose . Nausea or vomiting . Diarrhea  It is vitally important that if you feel that you have an infection such as this virus or any other virus that you stay home and away from places where you may spread it to others.  You should self-quarantine for 14 days if you have symptoms that could potentially be coronavirus or have been in close contact a with a person diagnosed with COVID-19 within the last 2 weeks. You should avoid contact with people age 54 and older.   You should wear a mask or cloth face covering over your nose and mouth if you must be around other people or animals, including pets (even at home). Try to stay at least 6 feet away from other people.  This will protect the people around you.   You may also take acetaminophen (Tylenol) as needed for fever. You have been enrolled in [facility], which will contact you regarding your symptoms. I have provided a work note   Reduce your risk of any infection by using the same precautions used for avoiding the common cold or flu:  Marland Kitchen Wash your hands often with soap and warm water for at least 20 seconds.  If soap and water are not readily available, use an alcohol-based hand sanitizer with at least 60% alcohol.  . If coughing or sneezing, cover your mouth and nose by coughing or sneezing into the elbow areas of your shirt or coat, into a tissue or into your sleeve (not your hands). . Avoid shaking hands with others and consider head nods or verbal greetings only. . Avoid touching your eyes, nose, or mouth with unwashed hands.  . Avoid close contact with people who are sick. . Avoid places or events with large numbers of people in one location, like concerts or sporting events. . Carefully consider travel plans you have or are making. . If you are planning any travel outside or inside the [place], visit the CDC's Travelers' Health webpage for the latest health notices. . If you have some symptoms but not all symptoms, continue to monitor at home and seek medical attention if your symptoms worsen. . If you are having a medical emergency, call 911.  HOME CARE . Only take medications as instructed  by your medical team. . Drink plenty of fluids and get plenty of rest. . A steam or ultrasonic humidifier can help if you have congestion.   GET HELP RIGHT AWAY IF YOU HAVE EMERGENCY WARNING SIGNS** FOR COVID-19. If you or someone is showing any of these signs seek emergency medical care immediately. Call 911 or proceed to your closest emergency facility if: . You develop worsening Pavlich fever. . Trouble breathing . Bluish lips or face . Persistent pain or pressure in the  chest . New confusion . Inability to wake or stay awake . You cough up blood. . Your symptoms become more severe  **This list is not all possible symptoms. Contact your medical provider for any symptoms that are sever or concerning to you.   MAKE SURE YOU   Understand these instructions.  Will watch your condition.  Will get help right away if you are not doing well or get worse.  Your e-visit answers were reviewed by a board certified advanced clinical practitioner to complete your personal care plan.  Depending on the condition, your plan could have included both over the counter or prescription medications.  If there is a problem please reply once you have received a response from your provider.  Your safety is important to us.  If you have drug allergies check your prescription carefully.    You can use MyChart to ask questions about today's visit, request a non-urgent call back, or ask for a work or school excuse for 24 hours related to this e-Visit. If it has been greater than 24 hours you will need to follow up with your provider, or enter a new e-Visit to address those concerns. You will get an e-mail in the next two days asking about your experience.  I hope that your e-visit has been valuable and will speed your recovery. Thank you for using e-visits.   I spent 5-10 minutes on review and completion of this note- Illa LevelSahar Jaquell Seddon [facility]

## 2018-10-10 ENCOUNTER — Encounter (INDEPENDENT_AMBULATORY_CARE_PROVIDER_SITE_OTHER): Payer: Self-pay

## 2018-10-11 ENCOUNTER — Encounter (INDEPENDENT_AMBULATORY_CARE_PROVIDER_SITE_OTHER): Payer: Self-pay

## 2018-10-12 ENCOUNTER — Encounter (INDEPENDENT_AMBULATORY_CARE_PROVIDER_SITE_OTHER): Payer: Self-pay

## 2018-10-13 ENCOUNTER — Encounter (INDEPENDENT_AMBULATORY_CARE_PROVIDER_SITE_OTHER): Payer: Self-pay

## 2018-10-14 ENCOUNTER — Encounter (INDEPENDENT_AMBULATORY_CARE_PROVIDER_SITE_OTHER): Payer: Self-pay

## 2018-10-15 ENCOUNTER — Encounter (INDEPENDENT_AMBULATORY_CARE_PROVIDER_SITE_OTHER): Payer: Self-pay

## 2018-10-16 ENCOUNTER — Encounter (INDEPENDENT_AMBULATORY_CARE_PROVIDER_SITE_OTHER): Payer: Self-pay

## 2018-10-17 ENCOUNTER — Encounter (INDEPENDENT_AMBULATORY_CARE_PROVIDER_SITE_OTHER): Payer: Self-pay

## 2018-10-18 ENCOUNTER — Encounter (INDEPENDENT_AMBULATORY_CARE_PROVIDER_SITE_OTHER): Payer: Self-pay

## 2018-10-19 ENCOUNTER — Encounter (INDEPENDENT_AMBULATORY_CARE_PROVIDER_SITE_OTHER): Payer: Self-pay

## 2018-10-20 ENCOUNTER — Encounter (INDEPENDENT_AMBULATORY_CARE_PROVIDER_SITE_OTHER): Payer: Self-pay

## 2018-11-05 ENCOUNTER — Other Ambulatory Visit: Payer: Self-pay

## 2018-11-05 ENCOUNTER — Ambulatory Visit
Admission: RE | Admit: 2018-11-05 | Discharge: 2018-11-05 | Disposition: A | Discharge status: Home / Self Care | Payer: Managed Care, Other (non HMO) | Source: Ambulatory Visit | Attending: Primary Care | Admitting: Primary Care

## 2018-11-05 DIAGNOSIS — Z1231 Encounter for screening mammogram for malignant neoplasm of breast: Secondary | ICD-10-CM | POA: Insufficient documentation

## 2018-12-27 ENCOUNTER — Other Ambulatory Visit: Payer: Self-pay | Admitting: Primary Care

## 2018-12-27 DIAGNOSIS — E039 Hypothyroidism, unspecified: Secondary | ICD-10-CM

## 2019-03-06 ENCOUNTER — Other Ambulatory Visit: Payer: Self-pay | Admitting: Primary Care

## 2019-04-08 ENCOUNTER — Other Ambulatory Visit: Payer: Self-pay | Admitting: Primary Care

## 2019-04-08 DIAGNOSIS — R7303 Prediabetes: Secondary | ICD-10-CM

## 2019-04-08 DIAGNOSIS — E039 Hypothyroidism, unspecified: Secondary | ICD-10-CM

## 2019-04-08 DIAGNOSIS — I10 Essential (primary) hypertension: Secondary | ICD-10-CM

## 2019-04-16 ENCOUNTER — Telehealth: Payer: Self-pay

## 2019-04-16 NOTE — Telephone Encounter (Signed)
LVM w COVID screen, front door and back lab info 1.26.2021 TLJ 

## 2019-04-18 ENCOUNTER — Other Ambulatory Visit: Payer: Self-pay

## 2019-04-18 ENCOUNTER — Other Ambulatory Visit (INDEPENDENT_AMBULATORY_CARE_PROVIDER_SITE_OTHER): Payer: Managed Care, Other (non HMO)

## 2019-04-18 DIAGNOSIS — I10 Essential (primary) hypertension: Secondary | ICD-10-CM

## 2019-04-18 DIAGNOSIS — R7303 Prediabetes: Secondary | ICD-10-CM | POA: Diagnosis not present

## 2019-04-18 DIAGNOSIS — E039 Hypothyroidism, unspecified: Secondary | ICD-10-CM | POA: Diagnosis not present

## 2019-04-18 LAB — COMPREHENSIVE METABOLIC PANEL
ALT: 18 U/L (ref 0–35)
AST: 27 U/L (ref 0–37)
Albumin: 4 g/dL (ref 3.5–5.2)
Alkaline Phosphatase: 85 U/L (ref 39–117)
BUN: 16 mg/dL (ref 6–23)
CO2: 32 mEq/L (ref 19–32)
Calcium: 9.2 mg/dL (ref 8.4–10.5)
Chloride: 100 mEq/L (ref 96–112)
Creatinine, Ser: 0.84 mg/dL (ref 0.40–1.20)
GFR: 85.24 mL/min (ref >60.00)
Glucose, Bld: 96 mg/dL (ref 70–99)
Potassium: 3.2 mEq/L — ABNORMAL LOW (ref 3.5–5.1)
Sodium: 138 mEq/L (ref 135–145)
Total Bilirubin: 0.5 mg/dL (ref 0.2–1.2)
Total Protein: 7.5 g/dL (ref 6.0–8.3)

## 2019-04-18 LAB — CBC
HCT: 39.6 % (ref 36.0–46.0)
Hemoglobin: 13.3 g/dL (ref 12.0–15.0)
MCHC: 33.6 g/dL (ref 30.0–36.0)
MCV: 94.9 fl (ref 78.0–100.0)
Platelets: 239 10*3/uL (ref 150.0–400.0)
RBC: 4.17 Mil/uL (ref 3.87–5.11)
RDW: 13.3 % (ref 11.5–15.5)
WBC: 4.3 10*3/uL (ref 4.0–10.5)

## 2019-04-18 LAB — LIPID PANEL
Cholesterol: 183 mg/dL (ref 0–200)
HDL: 53.1 mg/dL (ref >39.00)
LDL Cholesterol: 115 mg/dL — ABNORMAL HIGH (ref 0–99)
NonHDL: 130.28
Total CHOL/HDL Ratio: 3
Triglycerides: 78 mg/dL (ref 0.0–149.0)
VLDL: 15.6 mg/dL (ref 0.0–40.0)

## 2019-04-18 LAB — HEMOGLOBIN A1C: Hgb A1c MFr Bld: 5.9 % (ref 4.6–6.5)

## 2019-04-18 LAB — TSH: TSH: 0.56 u[IU]/mL (ref 0.35–4.50)

## 2019-04-22 ENCOUNTER — Ambulatory Visit (INDEPENDENT_AMBULATORY_CARE_PROVIDER_SITE_OTHER): Payer: Managed Care, Other (non HMO) | Admitting: Primary Care

## 2019-04-22 ENCOUNTER — Encounter: Payer: Self-pay | Admitting: Primary Care

## 2019-04-22 ENCOUNTER — Other Ambulatory Visit: Payer: Self-pay

## 2019-04-22 VITALS — BP 124/74 | HR 65 | Temp 97.2°F | Ht 64.0 in | Wt 255.0 lb

## 2019-04-22 DIAGNOSIS — E039 Hypothyroidism, unspecified: Secondary | ICD-10-CM

## 2019-04-22 DIAGNOSIS — K219 Gastro-esophageal reflux disease without esophagitis: Secondary | ICD-10-CM | POA: Diagnosis not present

## 2019-04-22 DIAGNOSIS — Z0000 Encounter for general adult medical examination without abnormal findings: Secondary | ICD-10-CM

## 2019-04-22 DIAGNOSIS — R7303 Prediabetes: Secondary | ICD-10-CM | POA: Diagnosis not present

## 2019-04-22 DIAGNOSIS — N926 Irregular menstruation, unspecified: Secondary | ICD-10-CM

## 2019-04-22 NOTE — Assessment & Plan Note (Signed)
Immunizations UTD. Pap smear UTD. Mammogram UTD, due in August 2021. Colonoscopy UTD, due in 2029. Encouraged a healthy diet, regular exercise. Exam today unremarkable. Labs reviewed.

## 2019-04-22 NOTE — Assessment & Plan Note (Signed)
Taking Synthroid with other medications, discussed to separate by 30 minutes. Otherwise taking correctly.  Recent TSH stable.  Continue Synthroid 125 mcg.

## 2019-04-22 NOTE — Assessment & Plan Note (Signed)
No symptoms of GERD, not taking anything OTC. Continue to monitor.

## 2019-04-22 NOTE — Assessment & Plan Note (Signed)
Return in prediabetic A1C range. Discussed to focus on a healthy diet, regular exercise. Continue to monitor.  Repeat in 6 months.

## 2019-04-22 NOTE — Patient Instructions (Signed)
Continue exercising. You should be getting 150 minutes of moderate intensity exercise weekly.  Continue to work on a healthy diet. Ensure you are consuming 64 ounces of water daily.  Schedule a lab only appointment for 6 months to repeat the blood sugar test.  It was a pleasure to see you today!   Preventive Care 49-55 Years Old, Female Preventive care refers to visits with your health care provider and lifestyle choices that can promote health and wellness. This includes:  A yearly physical exam. This may also be called an annual well check.  Regular dental visits and eye exams.  Immunizations.  Screening for certain conditions.  Healthy lifestyle choices, such as eating a healthy diet, getting regular exercise, not using drugs or products that contain nicotine and tobacco, and limiting alcohol use. What can I expect for my preventive care visit? Physical exam Your health care provider will check your:  Height and weight. This may be used to calculate body mass index (BMI), which tells if you are at a healthy weight.  Heart rate and blood pressure.  Skin for abnormal spots. Counseling Your health care provider may ask you questions about your:  Alcohol, tobacco, and drug use.  Emotional well-being.  Home and relationship well-being.  Sexual activity.  Eating habits.  Work and work Statistician.  Method of birth control.  Menstrual cycle.  Pregnancy history. What immunizations do I need?  Influenza (flu) vaccine  This is recommended every year. Tetanus, diphtheria, and pertussis (Tdap) vaccine  You may need a Td booster every 10 years. Varicella (chickenpox) vaccine  You may need this if you have not been vaccinated. Zoster (shingles) vaccine  You may need this after age 41. Measles, mumps, and rubella (MMR) vaccine  You may need at least one dose of MMR if you were born in 1957 or later. You may also need a second dose. Pneumococcal conjugate (PCV13)  vaccine  You may need this if you have certain conditions and were not previously vaccinated. Pneumococcal polysaccharide (PPSV23) vaccine  You may need one or two doses if you smoke cigarettes or if you have certain conditions. Meningococcal conjugate (MenACWY) vaccine  You may need this if you have certain conditions. Hepatitis A vaccine  You may need this if you have certain conditions or if you travel or work in places where you may be exposed to hepatitis A. Hepatitis B vaccine  You may need this if you have certain conditions or if you travel or work in places where you may be exposed to hepatitis B. Haemophilus influenzae type b (Hib) vaccine  You may need this if you have certain conditions. Human papillomavirus (HPV) vaccine  If recommended by your health care provider, you may need three doses over 6 months. You may receive vaccines as individual doses or as more than one vaccine together in one shot (combination vaccines). Talk with your health care provider about the risks and benefits of combination vaccines. What tests do I need? Blood tests  Lipid and cholesterol levels. These may be checked every 5 years, or more frequently if you are over 61 years old.  Hepatitis C test.  Hepatitis B test. Screening  Lung cancer screening. You may have this screening every year starting at age 20 if you have a 30-pack-year history of smoking and currently smoke or have quit within the past 15 years.  Colorectal cancer screening. All adults should have this screening starting at age 54 and continuing until age 71. Your health  care provider may recommend screening at age 31 if you are at increased risk. You will have tests every 1-10 years, depending on your results and the type of screening test.  Diabetes screening. This is done by checking your blood sugar (glucose) after you have not eaten for a while (fasting). You may have this done every 1-3 years.  Mammogram. This may be  done every 1-2 years. Talk with your health care provider about when you should start having regular mammograms. This may depend on whether you have a family history of breast cancer.  BRCA-related cancer screening. This may be done if you have a family history of breast, ovarian, tubal, or peritoneal cancers.  Pelvic exam and Pap test. This may be done every 3 years starting at age 23. Starting at age 48, this may be done every 5 years if you have a Pap test in combination with an HPV test. Other tests  Sexually transmitted disease (STD) testing.  Bone density scan. This is done to screen for osteoporosis. You may have this scan if you are at Strebel risk for osteoporosis. Follow these instructions at home: Eating and drinking  Eat a diet that includes fresh fruits and vegetables, whole grains, lean protein, and low-fat dairy.  Take vitamin and mineral supplements as recommended by your health care provider.  Do not drink alcohol if: ? Your health care provider tells you not to drink. ? You are pregnant, may be pregnant, or are planning to become pregnant.  If you drink alcohol: ? Limit how much you have to 0-1 drink a day. ? Be aware of how much alcohol is in your drink. In the [place] one drink equals one 12 oz bottle of beer (355 mL), one 5 oz glass of wine (148 mL), or one 1 oz glass of hard liquor (44 mL). Lifestyle  Take daily care of your teeth and gums.  Stay active. Exercise for at least 30 minutes on 5 or more days each week.  Do not use any products that contain nicotine or tobacco, such as cigarettes, e-cigarettes, and chewing tobacco. If you need help quitting, ask your health care provider.  If you are sexually active, practice safe sex. Use a condom or other form of birth control (contraception) in order to prevent pregnancy and STIs (sexually transmitted infections).  If told by your health care provider, take low-dose aspirin daily starting at age 57. What's  next?  Visit your health care provider once a year for a well check visit.  Ask your health care provider how often you should have your eyes and teeth checked.  Stay up to date on all vaccines. This information is not intended to replace advice given to you by your health care provider. Make sure you discuss any questions you have with your health care provider. Document Revised: 11/16/2017 Document Reviewed: 11/16/2017 Elsevier Patient Education  2020 [employer].

## 2019-04-22 NOTE — Progress Notes (Signed)
Subjective:    Patient ID: Vanessa Gardner, female    DOB: Jul 23, 1965, 54 y.o.   MRN: [id number]  HPI  This visit occurred during the SARS-CoV-2 public health emergency.  Safety protocols were in place, including screening questions prior to the visit, additional usage of staff PPE, and extensive cleaning of exam room while observing appropriate contact time as indicated for disinfecting solutions.   Vanessa Gardner is a 54 year old female who presents today for complete physical.  Immunizations: -Tetanus: Completed in 2017 -Influenza: Completed this season  -Shingles: Completed first Shingrix dose in 2020  Diet: She endorses a fair diet. Exercise: She was walking some on the trials, boot camp three days weekly.  Eye exam: Completed in 2020 Dental exam: Completes semi-annually   Pap Smear: Completed in 2020, negative Mammogram: Completed in August 2020 Colonoscopy: Completed in 2019, due in 2029  BP Readings from Last 3 Encounters:  04/22/19 124/74  05/14/18 122/76  04/18/18 120/74   The 10-year ASCVD risk score Mikey Bussing DC Jr., et al., 2013) is: 3.9%   Values used to calculate the score:     Age: 68 years     Sex: Female     Is Non-Hispanic African American: Yes     Diabetic: No     Tobacco smoker: No     Systolic Blood Pressure: 166 mmHg     Is BP treated: Yes     HDL Cholesterol: 53.1 mg/dL     Total Cholesterol: 183 mg/dL   Review of Systems  Constitutional: Negative for unexpected weight change.  HENT: Negative for rhinorrhea.   Respiratory: Negative for cough and shortness of breath.   Cardiovascular: Negative for chest pain.  Gastrointestinal: Negative for constipation and diarrhea.  Genitourinary: Negative for difficulty urinating.  Musculoskeletal: Negative for arthralgias and myalgias.  Skin: Negative for rash.  Allergic/Immunologic: Negative for environmental allergies.  Neurological: Negative for dizziness, numbness and headaches.  Psychiatric/Behavioral:  The patient is not nervous/anxious.        Past Medical History:  Diagnosis Date  . Allergy   . Chicken pox   . Essential hypertension   . GERD (gastroesophageal reflux disease)   . Graves disease   . Hypothyroidism      Social History   Socioeconomic History  . Marital status: Married    Spouse name: Not on file  . Number of children: Not on file  . Years of education: Not on file  . Highest education level: Not on file  Occupational History  . Not on file  Tobacco Use  . Smoking status: Never Smoker  . Smokeless tobacco: Never Used  Substance and Sexual Activity  . Alcohol use: Yes    Comment: twice a month-wine  . Drug use: No  . Sexual activity: Not on file  Other Topics Concern  . Not on file  Social History Narrative   ** Merged History Encounter **       Married. 2 children. Works as a Education officer, museum. Enjoys exercising, going to the movies, shopping.    Social Determinants of Health   Financial Resource Strain:   . Difficulty of Paying Living Expenses: Not on file  Food Insecurity:   . Worried About Charity fundraiser in the Last Year: Not on file  . Ran Out of Food in the Last Year: Not on file  Transportation Needs:   . Lack of Transportation (Medical): Not on file  . Lack of Transportation (Non-Medical): Not on file  Physical Activity:   . Days of Exercise per Week: Not on file  . Minutes of Exercise per Session: Not on file  Stress:   . Feeling of Stress : Not on file  Social Connections:   . Frequency of Communication with Friends and Family: Not on file  . Frequency of Social Gatherings with Friends and Family: Not on file  . Attends Religious Services: Not on file  . Active Member of Clubs or Organizations: Not on file  . Attends Banker Meetings: Not on file  . Marital Status: Not on file  Intimate Partner Violence:   . Fear of Current or Ex-Partner: Not on file  . Emotionally Abused: Not on file  . Physically Abused: Not  on file  . Sexually Abused: Not on file    Past Surgical History:  Procedure Laterality Date  . EYE SURGERY Right 2013  . TUBAL LIGATION      Family History  Problem Relation Age of Onset  . Breast cancer Neg Hx   . Alcohol abuse Father   . Hypertension Father   . Cirrhosis Father   . Hypertension Mother   . Throat cancer Maternal Grandfather   . Colon cancer Neg Hx   . Stomach cancer Neg Hx   . Esophageal cancer Neg Hx     Allergies  Allergen Reactions  . Sulfa Antibiotics Anaphylaxis  . Cefuroxime Axetil     REACTION: swelling  . Norvasc [Amlodipine Besylate] Swelling    Current Outpatient Medications on File Prior to Visit  Medication Sig Dispense Refill  . cholecalciferol (VITAMIN D) 1000 units tablet Take 1,000 Units by mouth daily.    Marland Kitchen loratadine (CLARITIN) 10 MG tablet Take 10 mg by mouth daily.    Marland Kitchen losartan-hydrochlorothiazide (HYZAAR) 50-12.5 MG tablet TAKE 1 TABLET BY MOUTH EVERY DAY 90 tablet 0  . SYNTHROID 125 MCG tablet TAKE 1 TABLET BY MOUTH EVERY DAY 90 tablet 1  . Turmeric 500 MG TABS Take by mouth daily.     Current Facility-Administered Medications on File Prior to Visit  Medication Dose Route Frequency Provider Last Rate Last Admin  . 0.9 %  sodium chloride infusion  500 mL Intravenous Once Armbruster, Willaim Rayas, MD        BP 124/74   Pulse 65   Temp (!) 97.2 F (36.2 C) (Temporal)   Ht 5\' 4"  (1.626 m)   Wt 255 lb (115.7 kg)   LMP 07/24/2017   SpO2 98%   BMI 43.77 kg/m    Objective:   Physical Exam  Constitutional: She is oriented to person, place, and time. She appears well-nourished.  HENT:  Right Ear: Tympanic membrane and ear canal normal.  Left Ear: Tympanic membrane and ear canal normal.  Mouth/Throat: Oropharynx is clear and moist.  Eyes: Pupils are equal, round, and reactive to light. EOM are normal.  Cardiovascular: Normal rate and regular rhythm.  Respiratory: Effort normal and breath sounds normal.  GI: Soft. Bowel  sounds are normal. There is no abdominal tenderness.  Musculoskeletal:        General: Normal range of motion.     Cervical back: Neck supple.  Neurological: She is alert and oriented to person, place, and time. No cranial nerve deficit.  Reflex Scores:      Patellar reflexes are 2+ on the right side and 2+ on the left side. Skin: Skin is warm and dry.  Psychiatric: She has a normal mood and affect.  Assessment & Plan:

## 2019-04-22 NOTE — Assessment & Plan Note (Signed)
Cessation of menstrual cycle for over one calendar year. Updated postmenopausal status.

## 2019-06-03 ENCOUNTER — Other Ambulatory Visit: Payer: Self-pay | Admitting: Primary Care

## 2019-06-21 ENCOUNTER — Other Ambulatory Visit: Payer: Self-pay | Admitting: Primary Care

## 2019-06-21 DIAGNOSIS — E039 Hypothyroidism, unspecified: Secondary | ICD-10-CM

## 2019-07-31 ENCOUNTER — Encounter: Payer: Self-pay | Admitting: Primary Care

## 2019-07-31 ENCOUNTER — Ambulatory Visit: Payer: Managed Care, Other (non HMO) | Admitting: Primary Care

## 2019-07-31 ENCOUNTER — Other Ambulatory Visit: Payer: Self-pay

## 2019-07-31 VITALS — BP 126/72 | HR 70 | Temp 96.5°F | Ht 64.0 in | Wt 251.8 lb

## 2019-07-31 DIAGNOSIS — F411 Generalized anxiety disorder: Secondary | ICD-10-CM | POA: Insufficient documentation

## 2019-07-31 MED ORDER — SERTRALINE HCL 25 MG PO TABS: 25.0000 mg | ORAL_TABLET | Freq: Every day | ORAL | 1 refills | Status: DC

## 2019-07-31 NOTE — Assessment & Plan Note (Signed)
Chronic and intermittent for years, did well with therapy in the past.   GAD 7 score of 8 today. Discussed options for treatment and she opts for both therapy and medication. Referral placed for therapy.   Rx for Zoloft 25 mg provided. We discussed possible side effects of headache, GI upset, drowsiness, and SI/HI. If thoughts of SI/HI develop, we discussed to present to the emergency immediately. Patient verbalized understanding.   Follow up in 6 weeks for re-evaluation.

## 2019-07-31 NOTE — Progress Notes (Signed)
Subjective:    Patient ID: Vanessa Gardner, female    DOB: 14-Sep-1965, 54 y.o.   MRN: [id number]  HPI  This visit occurred during the SARS-CoV-2 public health emergency.  Safety protocols were in place, including screening questions prior to the visit, additional usage of staff PPE, and extensive cleaning of exam room while observing appropriate contact time as indicated for disinfecting solutions.   Vanessa Gardner is a 53 year old female with a history of GERD, hypothyroidism, prediabetes who presents today to discuss anxiety. Diagnosed with GAD years ago.   She works in the [employer] and has had to go to trial for several cases over the last several months. When going to court she has a tough time with anxiety. Symptoms include nervous feeling, diarrhea, decreased appetite due to nerves, worry, difficulty relaxing. There was an incidence last week where she didn't feel she testified well in front of the judge and has felt anxious and worried since.   She has a history of anxiety in prior years, has been through therapy for which she completed in six months. She was never treated with medication in the past, but is wanting to start something low dose. GAD 7 score of 8 today. Denies symptoms of depression.    Review of Systems  Cardiovascular: Negative for palpitations.  Gastrointestinal: Positive for diarrhea.       See HPI, diarrhea during anxiety symptoms  Psychiatric/Behavioral: The patient is nervous/anxious.        See HPI       Past Medical History:  Diagnosis Date  . Allergy   . Chicken pox   . Essential hypertension   . GERD (gastroesophageal reflux disease)   . Graves disease   . Hypothyroidism      Social History   Socioeconomic History  . Marital status: Married    Spouse name: Not on file  . Number of children: Not on file  . Years of education: Not on file  . Highest education level: Not on file  Occupational History  . Not on file  Tobacco Use  .  Smoking status: Never Smoker  . Smokeless tobacco: Never Used  Substance and Sexual Activity  . Alcohol use: Yes    Comment: twice a month-wine  . Drug use: No  . Sexual activity: Not on file  Other Topics Concern  . Not on file  Social History Narrative   ** Merged History Encounter **       Married. 2 children. Works as a Education officer, museum. Enjoys exercising, going to the movies, shopping.    Social Determinants of Health   Financial Resource Strain:   . Difficulty of Paying Living Expenses:   Food Insecurity:   . Worried About Charity fundraiser in the Last Year:   . Arboriculturist in the Last Year:   Transportation Needs:   . Film/video editor (Medical):   Marland Kitchen Lack of Transportation (Non-Medical):   Physical Activity:   . Days of Exercise per Week:   . Minutes of Exercise per Session:   Stress:   . Feeling of Stress :   Social Connections:   . Frequency of Communication with Friends and Family:   . Frequency of Social Gatherings with Friends and Family:   . Attends Religious Services:   . Active Member of Clubs or Organizations:   . Attends Archivist Meetings:   Marland Kitchen Marital Status:   Intimate Partner Violence:   .  Fear of Current or Ex-Partner:   . Emotionally Abused:   Marland Kitchen Physically Abused:   . Sexually Abused:     Past Surgical History:  Procedure Laterality Date  . EYE SURGERY Right 2013  . TUBAL LIGATION      Family History  Problem Relation Age of Onset  . Breast cancer Neg Hx   . Alcohol abuse Father   . Hypertension Father   . Cirrhosis Father   . Hypertension Mother   . Throat cancer Maternal Grandfather   . Colon cancer Neg Hx   . Stomach cancer Neg Hx   . Esophageal cancer Neg Hx     Allergies  Allergen Reactions  . Sulfa Antibiotics Anaphylaxis  . Cefuroxime Axetil     REACTION: swelling  . Norvasc [Amlodipine Besylate] Swelling    Current Outpatient Medications on File Prior to Visit  Medication Sig Dispense Refill  .  cholecalciferol (VITAMIN D) 1000 units tablet Take 1,000 Units by mouth daily.    Marland Kitchen loratadine (CLARITIN) 10 MG tablet Take 10 mg by mouth daily.    Marland Kitchen losartan-hydrochlorothiazide (HYZAAR) 50-12.5 MG tablet TAKE 1 TABLET BY MOUTH EVERY DAY 90 tablet 1  . SYNTHROID 125 MCG tablet TAKE 1 TABLET BY MOUTH EVERY DAY 90 tablet 2  . Turmeric 500 MG TABS Take by mouth daily.     Current Facility-Administered Medications on File Prior to Visit  Medication Dose Route Frequency Provider Last Rate Last Admin  . 0.9 %  sodium chloride infusion  500 mL Intravenous Once Armbruster, Willaim Rayas, MD        BP 126/72   Pulse 70   Temp (!) 96.5 F (35.8 C) (Temporal)   Ht 5\' 4"  (1.626 m)   Wt 251 lb 12 oz (114.2 kg)   LMP 07/24/2017   SpO2 97%   BMI 43.21 kg/m    Objective:   Physical Exam  Constitutional: She appears well-nourished.  Cardiovascular: Normal rate and regular rhythm.  Respiratory: Effort normal and breath sounds normal.  Musculoskeletal:     Cervical back: Neck supple.  Skin: Skin is warm and dry.  Psychiatric: She has a normal mood and affect.           Assessment & Plan:

## 2019-07-31 NOTE — Patient Instructions (Signed)
Start sertraline (Zoloft) 25 mg once daily for anxiety.   You will be contacted regarding your referral to therapy.  Please let us know if you have not been contacted within two weeks.   Please schedule a follow up visit for 6 weeks as discussed.   It was a pleasure to see you today!

## 2019-09-09 ENCOUNTER — Ambulatory Visit: Payer: Managed Care, Other (non HMO) | Admitting: Primary Care

## 2019-09-17 ENCOUNTER — Ambulatory Visit (INDEPENDENT_AMBULATORY_CARE_PROVIDER_SITE_OTHER): Payer: 59 | Admitting: Psychology

## 2019-09-17 DIAGNOSIS — F411 Generalized anxiety disorder: Secondary | ICD-10-CM | POA: Diagnosis not present

## 2019-09-24 ENCOUNTER — Ambulatory Visit (INDEPENDENT_AMBULATORY_CARE_PROVIDER_SITE_OTHER): Payer: 59 | Admitting: Psychology

## 2019-09-24 DIAGNOSIS — F411 Generalized anxiety disorder: Secondary | ICD-10-CM | POA: Diagnosis not present

## 2019-09-25 ENCOUNTER — Other Ambulatory Visit: Payer: Self-pay | Admitting: Primary Care

## 2019-09-25 DIAGNOSIS — F411 Generalized anxiety disorder: Secondary | ICD-10-CM

## 2019-09-30 ENCOUNTER — Telehealth (INDEPENDENT_AMBULATORY_CARE_PROVIDER_SITE_OTHER): Payer: Managed Care, Other (non HMO) | Admitting: Primary Care

## 2019-09-30 DIAGNOSIS — F411 Generalized anxiety disorder: Secondary | ICD-10-CM

## 2019-09-30 MED ORDER — SERTRALINE HCL 25 MG PO TABS: 25.0000 mg | ORAL_TABLET | Freq: Every day | ORAL | 2 refills | Status: DC

## 2019-09-30 NOTE — Assessment & Plan Note (Signed)
Improved with combination of Zoloft and therapy. No negative side effects from Zoloft. Refills sent to pharmacy. Continue therapy. She will update with any changes.

## 2019-09-30 NOTE — Progress Notes (Signed)
Subjective:    Patient ID: Vanessa Gardner, female    DOB: [birth year], 54 y.o.   MRN: [id number]  HPI  This visit occurred during the SARS-CoV-2 public health emergency.  Safety protocols were in place, including screening questions prior to the visit, additional usage of staff PPE, and extensive cleaning of exam room while observing appropriate contact time as indicated for disinfecting solutions.   Virtual Visit via Video Note  I connected with Vanessa Gardner on 27/06/23 at  9:20 AM EDT by a video enabled telemedicine application and verified that I am speaking with the correct person using two identifiers.  Location: Patient: Home Provider: Office Participants: Myself and patient   I discussed the limitations of evaluation and management by telemedicine and the availability of in person appointments. The patient expressed understanding and agreed to proceed.  History of Present Illness:  Vanessa Gardner is a 54 year old female with a history of hypothyroidism, prediabetes, anxiety who presents today for follow up of anxiety.   She was last evaluated on 07/31/19 with symptoms of anxiety including feeling nervous, diarrhea, decreased appetite, worry, difficulty relaxing. She works for the [employer] and had difficulty with presenting and testifying in front of her judge. GAD 7 score of 8. We discussed options for treatment, decided to trial low dose Zoloft 25 mg and referred her to therapy.   Since her last visit she's doing better. She didn't start Zoloft initially, has been on it now for about one month. Positive effects from Zoloft include feeling more calm, less anxiety. She's also met with her therapist twice, is finding sessions helpful, plans to continue sessions. She denies SI/HI, GI upset, nausea.    Observations/Objective:  Alert and oriented. Appears well, not sickly. No distress. Speaking in complete sentences.   Assessment and Plan:  Improved with  combination of Zoloft and therapy. No negative side effects from Zoloft. Refills sent to pharmacy. Continue therapy. She will update with any changes.   Follow Up Instructions:  Continue Zoloft 25 mg daily for anxiety.  Continue regular follow up with your therapist.  It was a pleasure to see you today! Allie Bossier, NP-C    I discussed the assessment and treatment plan with the patient. The patient was provided an opportunity to ask questions and all were answered. The patient agreed with the plan and demonstrated an understanding of the instructions.   The patient was advised to call back or seek an in-person evaluation if the symptoms worsen or if the condition fails to improve as anticipated.    Pleas Koch, NP    Review of Systems  Gastrointestinal: Negative for abdominal pain and nausea.  Neurological: Negative for headaches.  Psychiatric/Behavioral: Negative for suicidal ideas.       See HPI       Past Medical History:  Diagnosis Date  . Allergy   . Chicken pox   . Essential hypertension   . GERD (gastroesophageal reflux disease)   . Graves disease   . Hypothyroidism      Social History   Socioeconomic History  . Marital status: Married    Spouse name: Not on file  . Number of children: Not on file  . Years of education: Not on file  . Highest education level: Not on file  Occupational History  . Not on file  Tobacco Use  . Smoking status: Never Smoker  . Smokeless tobacco: Never Used  Vaping Use  . Vaping Use: Never  used  Substance and Sexual Activity  . Alcohol use: Yes    Comment: twice a month-wine  . Drug use: No  . Sexual activity: Not on file  Other Topics Concern  . Not on file  Social History Narrative   ** Merged History Encounter **       Married. 2 children. Works as a Social Worker. Enjoys exercising, going to the movies, shopping.    Social Determinants of Health   Financial Resource Strain:   . Difficulty of Paying  Living Expenses:   Food Insecurity:   . Worried About Running Out of Food in the Last Year:   . Ran Out of Food in the Last Year:   Transportation Needs:   . Lack of Transportation (Medical):   . Lack of Transportation (Non-Medical):   Physical Activity:   . Days of Exercise per Week:   . Minutes of Exercise per Session:   Stress:   . Feeling of Stress :   Social Connections:   . Frequency of Communication with Friends and Family:   . Frequency of Social Gatherings with Friends and Family:   . Attends Religious Services:   . Active Member of Clubs or Organizations:   . Attends Club or Organization Meetings:   . Marital Status:   Intimate Partner Violence:   . Fear of Current or Ex-Partner:   . Emotionally Abused:   . Physically Abused:   . Sexually Abused:     Past Surgical History:  Procedure Laterality Date  . EYE SURGERY Right 2013  . TUBAL LIGATION      Family History  Problem Relation Age of Onset  . Breast cancer Neg Hx   . Alcohol abuse Father   . Hypertension Father   . Cirrhosis Father   . Hypertension Mother   . Throat cancer Maternal Grandfather   . Colon cancer Neg Hx   . Stomach cancer Neg Hx   . Esophageal cancer Neg Hx     Allergies  Allergen Reactions  . Sulfa Antibiotics Anaphylaxis  . Cefuroxime Axetil     REACTION: swelling  . Norvasc [Amlodipine Besylate] Swelling    Current Outpatient Medications on File Prior to Visit  Medication Sig Dispense Refill  . cholecalciferol (VITAMIN D) 1000 units tablet Take 1,000 Units by mouth daily.    . loratadine (CLARITIN) 10 MG tablet Take 10 mg by mouth daily.    . losartan-hydrochlorothiazide (HYZAAR) 50-12.5 MG tablet TAKE 1 TABLET BY MOUTH EVERY DAY 90 tablet 1  . SYNTHROID 125 MCG tablet TAKE 1 TABLET BY MOUTH EVERY DAY 90 tablet 2  . Turmeric 500 MG TABS Take by mouth daily.     Current Facility-Administered Medications on File Prior to Visit  Medication Dose Route Frequency Provider Last Rate  Last Admin  . 0.9 %  sodium chloride infusion  500 mL Intravenous Once Armbruster, Steven P, MD        LMP 07/24/2017    Objective:   Physical Exam Pulmonary:     Effort: Pulmonary effort is normal.  Neurological:     Mental Status: She is alert and oriented to person, place, and time.  Psychiatric:        Mood and Affect: Mood normal.            Assessment & Plan:   

## 2019-09-30 NOTE — Patient Instructions (Signed)
Continue Zoloft 25 mg daily for anxiety.  Continue regular follow up with your therapist.  It was a pleasure to see you today! Mayra Reel, NP-C

## 2019-10-01 ENCOUNTER — Ambulatory Visit: Payer: 59 | Admitting: Psychology

## 2019-10-04 ENCOUNTER — Other Ambulatory Visit: Payer: Self-pay | Admitting: Primary Care

## 2019-10-04 DIAGNOSIS — R7303 Prediabetes: Secondary | ICD-10-CM

## 2019-10-22 ENCOUNTER — Other Ambulatory Visit: Payer: Self-pay

## 2019-10-22 ENCOUNTER — Other Ambulatory Visit (INDEPENDENT_AMBULATORY_CARE_PROVIDER_SITE_OTHER): Payer: Managed Care, Other (non HMO)

## 2019-10-22 DIAGNOSIS — R7303 Prediabetes: Secondary | ICD-10-CM

## 2019-10-22 LAB — LIPID PANEL
Cholesterol: 190 mg/dL (ref 0–200)
HDL: 50 mg/dL (ref >39.00)
LDL Cholesterol: 128 mg/dL — ABNORMAL HIGH (ref 0–99)
NonHDL: 140.12
Total CHOL/HDL Ratio: 4
Triglycerides: 63 mg/dL (ref 0.0–149.0)
VLDL: 12.6 mg/dL (ref 0.0–40.0)

## 2019-10-22 LAB — HEMOGLOBIN A1C: Hgb A1c MFr Bld: 5.8 % (ref 4.6–6.5)

## 2019-12-04 ENCOUNTER — Ambulatory Visit: Payer: 59 | Admitting: Psychology

## 2020-02-11 ENCOUNTER — Other Ambulatory Visit: Payer: Self-pay | Admitting: Primary Care

## 2020-03-20 ENCOUNTER — Other Ambulatory Visit: Payer: Self-pay | Admitting: Primary Care

## 2020-03-20 DIAGNOSIS — E039 Hypothyroidism, unspecified: Secondary | ICD-10-CM

## 2020-04-21 ENCOUNTER — Other Ambulatory Visit (INDEPENDENT_AMBULATORY_CARE_PROVIDER_SITE_OTHER): Payer: Managed Care, Other (non HMO)

## 2020-04-21 ENCOUNTER — Other Ambulatory Visit: Payer: Self-pay

## 2020-04-21 ENCOUNTER — Other Ambulatory Visit: Payer: Self-pay | Admitting: Primary Care

## 2020-04-21 DIAGNOSIS — E039 Hypothyroidism, unspecified: Secondary | ICD-10-CM | POA: Diagnosis not present

## 2020-04-21 DIAGNOSIS — R7303 Prediabetes: Secondary | ICD-10-CM

## 2020-04-21 DIAGNOSIS — I10 Essential (primary) hypertension: Secondary | ICD-10-CM | POA: Diagnosis not present

## 2020-04-21 LAB — COMPREHENSIVE METABOLIC PANEL
ALT: 17 U/L (ref 0–35)
AST: 24 U/L (ref 0–37)
Albumin: 3.9 g/dL (ref 3.5–5.2)
Alkaline Phosphatase: 81 U/L (ref 39–117)
BUN: 16 mg/dL (ref 6–23)
CO2: 33 mEq/L — ABNORMAL HIGH (ref 19–32)
Calcium: 9 mg/dL (ref 8.4–10.5)
Chloride: 100 mEq/L (ref 96–112)
Creatinine, Ser: 0.89 mg/dL (ref 0.40–1.20)
GFR: 72.79 mL/min (ref >60.00)
Glucose, Bld: 93 mg/dL (ref 70–99)
Potassium: 3.5 mEq/L (ref 3.5–5.1)
Sodium: 137 mEq/L (ref 135–145)
Total Bilirubin: 0.4 mg/dL (ref 0.2–1.2)
Total Protein: 7 g/dL (ref 6.0–8.3)

## 2020-04-21 LAB — CBC
HCT: 39.3 % (ref 36.0–46.0)
Hemoglobin: 13.3 g/dL (ref 12.0–15.0)
MCHC: 33.9 g/dL (ref 30.0–36.0)
MCV: 93.1 fl (ref 78.0–100.0)
Platelets: 246 10*3/uL (ref 150.0–400.0)
RBC: 4.23 Mil/uL (ref 3.87–5.11)
RDW: 13.8 % (ref 11.5–15.5)
WBC: 3.8 10*3/uL — ABNORMAL LOW (ref 4.0–10.5)

## 2020-04-21 LAB — LIPID PANEL
Cholesterol: 199 mg/dL (ref 0–200)
HDL: 55.4 mg/dL (ref >39.00)
LDL Cholesterol: 127 mg/dL — ABNORMAL HIGH (ref 0–99)
NonHDL: 144.03
Total CHOL/HDL Ratio: 4
Triglycerides: 86 mg/dL (ref 0.0–149.0)
VLDL: 17.2 mg/dL (ref 0.0–40.0)

## 2020-04-21 LAB — HEMOGLOBIN A1C: Hgb A1c MFr Bld: 5.9 % (ref 4.6–6.5)

## 2020-04-21 LAB — TSH: TSH: 2.5 u[IU]/mL (ref 0.35–4.50)

## 2020-04-23 ENCOUNTER — Encounter: Payer: Self-pay | Admitting: Primary Care

## 2020-04-23 ENCOUNTER — Ambulatory Visit (INDEPENDENT_AMBULATORY_CARE_PROVIDER_SITE_OTHER): Payer: Managed Care, Other (non HMO) | Admitting: Primary Care

## 2020-04-23 ENCOUNTER — Other Ambulatory Visit: Payer: Self-pay

## 2020-04-23 VITALS — BP 122/70 | HR 74 | Temp 97.8°F | Ht 64.0 in | Wt 260.4 lb

## 2020-04-23 DIAGNOSIS — I10 Essential (primary) hypertension: Secondary | ICD-10-CM | POA: Insufficient documentation

## 2020-04-23 DIAGNOSIS — R7303 Prediabetes: Secondary | ICD-10-CM | POA: Diagnosis not present

## 2020-04-23 DIAGNOSIS — Z1231 Encounter for screening mammogram for malignant neoplasm of breast: Secondary | ICD-10-CM

## 2020-04-23 DIAGNOSIS — Z0000 Encounter for general adult medical examination without abnormal findings: Secondary | ICD-10-CM

## 2020-04-23 DIAGNOSIS — E039 Hypothyroidism, unspecified: Secondary | ICD-10-CM | POA: Diagnosis not present

## 2020-04-23 DIAGNOSIS — F411 Generalized anxiety disorder: Secondary | ICD-10-CM

## 2020-04-23 DIAGNOSIS — Z23 Encounter for immunization: Secondary | ICD-10-CM

## 2020-04-23 MED ORDER — LOSARTAN POTASSIUM-HCTZ 50-12.5 MG PO TABS: 1.0000 | ORAL_TABLET | Freq: Every day | ORAL | 3 refills | Status: DC

## 2020-04-23 MED ORDER — SERTRALINE HCL 25 MG PO TABS: 25.0000 mg | ORAL_TABLET | Freq: Every day | ORAL | 3 refills | Status: DC

## 2020-04-23 NOTE — Addendum Note (Signed)
Addended by: Burnell Blanks on: 04/23/2020 09:22 AM   Modules accepted: Orders

## 2020-04-23 NOTE — Progress Notes (Signed)
Subjective:    Patient ID: Vanessa Gardner, female    DOB: 06-Jun-1965, 55 y.o.   MRN: [id number]  HPI  This visit occurred during the SARS-CoV-2 public health emergency.  Safety protocols were in place, including screening questions prior to the visit, additional usage of staff PPE, and extensive cleaning of exam room while observing appropriate contact time as indicated for disinfecting solutions.   Vanessa Gardner is a 55 year old female who presents today for complete physical.  Immunizations: -Tetanus: 2017 -Influenza: Completed this season  -Shingles: Completed one dose -Covid-19: Completed 3 doses  Diet: She endorses an improved diet.  Exercise: She is exercising regularly.   Eye exam: Completes annually  Dental exam: Completes semi-annually  Pap Smear: 2020, negative Mammogram: August 2020 Colonoscopy: Completed in 2019, due in 2029   BP Readings from Last 3 Encounters:  04/23/20 122/70  07/31/19 126/72  04/22/19 124/74   The 10-year ASCVD risk score Denman George DC Jr., et al., 2013) is: 4.1%   Values used to calculate the score:     Age: 87 years     Sex: Female     Is Non-Hispanic African American: Yes     Diabetic: No     Tobacco smoker: No     Systolic Blood Pressure: 122 mmHg     Is BP treated: Yes     HDL Cholesterol: 55.4 mg/dL     Total Cholesterol: 199 mg/dL   Review of Systems  Constitutional: Negative for unexpected weight change.  HENT: Negative for rhinorrhea.   Eyes: Negative for visual disturbance.  Respiratory: Negative for cough and shortness of breath.   Cardiovascular: Negative for chest pain.  Gastrointestinal: Negative for constipation and diarrhea.  Genitourinary: Negative for difficulty urinating.  Musculoskeletal: Negative for arthralgias and myalgias.  Skin: Negative for rash.  Allergic/Immunologic: Negative for environmental allergies.  Neurological: Negative for dizziness, numbness and headaches.  Psychiatric/Behavioral:       Feels  well controlled on sertraline 25 mg.       Past Medical History:  Diagnosis Date  . Allergy   . Chicken pox   . Essential hypertension   . GERD (gastroesophageal reflux disease)   . Graves disease   . Hypothyroidism      Social History   Socioeconomic History  . Marital status: Married    Spouse name: Not on file  . Number of children: Not on file  . Years of education: Not on file  . Highest education level: Not on file  Occupational History  . Not on file  Tobacco Use  . Smoking status: Never Smoker  . Smokeless tobacco: Never Used  Vaping Use  . Vaping Use: Never used  Substance and Sexual Activity  . Alcohol use: Yes    Comment: twice a month-wine  . Drug use: No  . Sexual activity: Not on file  Other Topics Concern  . Not on file  Social History Narrative   ** Merged History Encounter **       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 Enjoys exercising, going to the movies, shopping.    Social Determinants of Health   Financial Resource Strain: Not on file  Food Insecurity: Not on file  Transportation Needs: Not on file  Physical Activity: Not on file  Stress: Not on file  Social Connections: Not on file  Intimate Partner Violence: Not on file    Past Surgical History:  Procedure Laterality Date  . EYE SURGERY Right  2013  . TUBAL LIGATION      Family History  Problem Relation Age of Onset  . Breast cancer Neg Hx   . Alcohol abuse Father   . Hypertension Father   . Cirrhosis Father   . Hypertension Mother   . Throat cancer Maternal Grandfather   . Colon cancer Neg Hx   . Stomach cancer Neg Hx   . Esophageal cancer Neg Hx     Allergies  Allergen Reactions  . Sulfa Antibiotics Anaphylaxis  . Cefuroxime Axetil     REACTION: swelling  . Norvasc [Amlodipine Besylate] Swelling    Current Outpatient Medications on File Prior to Visit  Medication Sig Dispense Refill  . cholecalciferol (VITAMIN D) 1000 units tablet Take 1,000 Units  by mouth daily.    Marland Kitchen loratadine (CLARITIN) 10 MG tablet Take 10 mg by mouth daily.    Marland Kitchen SYNTHROID 125 MCG tablet TAKE 1 TABLET BY MOUTH EVERY DAY 90 tablet 2   Current Facility-Administered Medications on File Prior to Visit  Medication Dose Route Frequency Provider Last Rate Last Admin  . 0.9 %  sodium chloride infusion  500 mL Intravenous Once Armbruster, Willaim Rayas, MD        BP 122/70   Pulse 74   Temp 97.8 F (36.6 C) (Oral)   Ht 5\' 4"  (1.626 m)   Wt 260 lb 6.4 oz (118.1 kg)   LMP 07/24/2017   SpO2 98%   BMI 44.70 kg/m    Objective:   Physical Exam Constitutional:      Appearance: She is well-nourished.  HENT:     Right Ear: Tympanic membrane and ear canal normal.     Left Ear: Tympanic membrane and ear canal normal.     Mouth/Throat:     Mouth: Oropharynx is clear and moist.  Eyes:     Extraocular Movements: EOM normal.     Pupils: Pupils are equal, round, and reactive to light.  Cardiovascular:     Rate and Rhythm: Normal rate and regular rhythm.  Pulmonary:     Effort: Pulmonary effort is normal.     Breath sounds: Normal breath sounds.  Abdominal:     General: Bowel sounds are normal.     Palpations: Abdomen is soft.     Tenderness: There is no abdominal tenderness.  Musculoskeletal:        General: Normal range of motion.     Cervical back: Neck supple.  Skin:    General: Skin is warm and dry.  Neurological:     Mental Status: She is alert and oriented to person, place, and time.     Cranial Nerves: No cranial nerve deficit.     Deep Tendon Reflexes:     Reflex Scores:      Patellar reflexes are 2+ on the right side and 2+ on the left side. Psychiatric:        Mood and Affect: Mood and affect and mood normal.            Assessment & Plan:

## 2020-04-23 NOTE — Assessment & Plan Note (Signed)
Doing well on sertraline 25 mg, continue same. °Refills sent to pharmacy. °

## 2020-04-23 NOTE — Assessment & Plan Note (Signed)
Well controlled on losartan-HCTZ 50-12.5 mg, continue same. CMP reviewed.

## 2020-04-23 NOTE — Patient Instructions (Signed)
Start exercising. You should be getting 150 minutes of moderate intensity exercise weekly.  Continue to work on a healthy diet. Ensure you are consuming 64 ounces of water daily.  [facility].   Schedule a lab appointment for 6 months to repeat cholesterol, blood counts, and A1C.  It was a pleasure to see you today!   Preventive Care 56-56 Years Old, Female Preventive care refers to lifestyle choices and visits with your health care provider that can promote health and wellness. This includes:  A yearly physical exam. This is also called an annual wellness visit.  Regular dental and eye exams.  Immunizations.  Screening for certain conditions.  Healthy lifestyle choices, such as: ? Eating a healthy diet. ? Getting regular exercise. ? Not using drugs or products that contain nicotine and tobacco. ? Limiting alcohol use. What can I expect for my preventive care visit? Physical exam Your health care provider will check your:  Height and weight. These may be used to calculate your BMI (body mass index). BMI is a measurement that tells if you are at a healthy weight.  Heart rate and blood pressure.  Body temperature.  Skin for abnormal spots. Counseling Your health care provider may ask you questions about your:  Past medical problems.  Family's medical history.  Alcohol, tobacco, and drug use.  Emotional well-being.  Home life and relationship well-being.  Sexual activity.  Diet, exercise, and sleep habits.  Work and work Statistician.  Access to firearms.  Method of birth control.  Menstrual cycle.  Pregnancy history. What immunizations do I need? Vaccines are usually given at various ages, according to a schedule. Your health care provider will recommend vaccines for you based on your age, medical history, and lifestyle or other factors, such as travel or where you work.   What tests do I need? Blood tests  Lipid  and cholesterol levels. These may be checked every 5 years, or more often if you are over 87 years old.  Hepatitis C test.  Hepatitis B test. Screening  Lung cancer screening. You may have this screening every year starting at age 60 if you have a 30-pack-year history of smoking and currently smoke or have quit within the past 15 years.  Colorectal cancer screening. ? All adults should have this screening starting at age 55 and continuing until age 64. ? Your health care provider may recommend screening at age 68 if you are at increased risk. ? You will have tests every 1-10 years, depending on your results and the type of screening test.  Diabetes screening. ? This is done by checking your blood sugar (glucose) after you have not eaten for a while (fasting). ? You may have this done every 1-3 years.  Mammogram. ? This may be done every 1-2 years. ? Talk with your health care provider about when you should start having regular mammograms. This may depend on whether you have a family history of breast cancer.  BRCA-related cancer screening. This may be done if you have a family history of breast, ovarian, tubal, or peritoneal cancers.  Pelvic exam and Pap test. ? This may be done every 3 years starting at age 49. ? Starting at age 15, this may be done every 5 years if you have a Pap test in combination with an HPV test. Other tests  STD (sexually transmitted disease) testing, if you are at risk.  Bone density scan. This is done to screen for  osteoporosis. You may have this scan if you are at [place] risk for osteoporosis. Talk with your health care provider about your test results, treatment options, and if necessary, the need for more tests. Follow these instructions at home: Eating and drinking  Eat a diet that includes fresh fruits and vegetables, whole grains, lean protein, and low-fat dairy products.  Take vitamin and mineral supplements as recommended by your health care  provider.  Do not drink alcohol if: ? Your health care provider tells you not to drink. ? You are pregnant, may be pregnant, or are planning to become pregnant.  If you drink alcohol: ? Limit how much you have to 0-1 drink a day. ? Be aware of how much alcohol is in your drink. In the [place] one drink equals one 12 oz bottle of beer (355 mL), one 5 oz glass of wine (148 mL), or one 1 oz glass of hard liquor (44 mL).   Lifestyle  Take daily care of your teeth and gums. Brush your teeth every morning and night with fluoride toothpaste. Floss one time each day.  Stay active. Exercise for at least 30 minutes 5 or more days each week.  Do not use any products that contain nicotine or tobacco, such as cigarettes, e-cigarettes, and chewing tobacco. If you need help quitting, ask your health care provider.  Do not use drugs.  If you are sexually active, practice safe sex. Use a condom or other form of protection to prevent STIs (sexually transmitted infections).  If you do not wish to become pregnant, use a form of birth control. If you plan to become pregnant, see your health care provider for a prepregnancy visit.  If told by your health care provider, take low-dose aspirin daily starting at age 10.  Find healthy ways to cope with stress, such as: ? Meditation, yoga, or listening to music. ? Journaling. ? Talking to a trusted person. ? Spending time with friends and family. Safety  Always wear your seat belt while driving or riding in a vehicle.  Do not drive: ? If you have been drinking alcohol. Do not ride with someone who has been drinking. ? When you are tired or distracted. ? While texting.  Wear a helmet and other protective equipment during sports activities.  If you have firearms in your house, make sure you follow all gun safety procedures. What's next?  Visit your health care provider once a year for an annual wellness visit.  Ask your health care provider how often  you should have your eyes and teeth checked.  Stay up to date on all vaccines. This information is not intended to replace advice given to you by your health care provider. Make sure you discuss any questions you have with your health care provider. Document Revised: 12/10/2019 Document Reviewed: 11/16/2017 Elsevier Patient Education  2021 [employer].

## 2020-04-23 NOTE — Assessment & Plan Note (Signed)
She is taking Synthroid 125 mcg correctly. Recent TSH stable.  Continue Synthroid 125 mcg daily.

## 2020-04-23 NOTE — Assessment & Plan Note (Signed)
She has recently joined [employer] and is exercising, encouraged her to continue.  A1C of 5.9 recently.  Repeat A1C in 6 months.

## 2020-04-23 NOTE — Assessment & Plan Note (Signed)
Second Shingrix due, provided today. Other vaccines UTD.  Mammogram due, orders placed, she will call to schedule. Pap smear UTD, due in 2023. Colonoscopy UTD, due in 2029.  Discussed the importance of a healthy diet and regular exercise in order for weight loss, and to reduce the risk of any potential medical problems.  Exam today unremarkable. Labs reviewed.

## 2020-05-25 ENCOUNTER — Other Ambulatory Visit: Payer: Self-pay | Admitting: Obstetrics and Gynecology

## 2020-06-12 ENCOUNTER — Other Ambulatory Visit: Payer: Self-pay

## 2020-06-12 ENCOUNTER — Ambulatory Visit
Admission: RE | Admit: 2020-06-12 | Discharge: 2020-06-12 | Disposition: A | Discharge status: Home / Self Care | Payer: Managed Care, Other (non HMO) | Source: Ambulatory Visit | Attending: Primary Care | Admitting: Primary Care

## 2020-06-12 DIAGNOSIS — Z1231 Encounter for screening mammogram for malignant neoplasm of breast: Secondary | ICD-10-CM | POA: Insufficient documentation

## 2020-07-17 ENCOUNTER — Other Ambulatory Visit: Payer: Self-pay

## 2020-07-17 ENCOUNTER — Ambulatory Visit: Payer: Managed Care, Other (non HMO) | Admitting: Primary Care

## 2020-07-17 ENCOUNTER — Encounter: Payer: Self-pay | Admitting: Primary Care

## 2020-07-17 ENCOUNTER — Ambulatory Visit (INDEPENDENT_AMBULATORY_CARE_PROVIDER_SITE_OTHER)
Admission: RE | Admit: 2020-07-17 | Discharge: 2020-07-17 | Disposition: A | Discharge status: Home / Self Care | Payer: Managed Care, Other (non HMO) | Source: Ambulatory Visit | Attending: Primary Care | Admitting: Primary Care

## 2020-07-17 VITALS — BP 122/70 | HR 53 | Temp 97.7°F | Ht 64.0 in | Wt 256.0 lb

## 2020-07-17 DIAGNOSIS — M25562 Pain in left knee: Secondary | ICD-10-CM | POA: Diagnosis not present

## 2020-07-17 NOTE — Progress Notes (Signed)
Subjective:    Patient ID: Vanessa Gardner, female    DOB: 1965-09-29, 55 y.o.   MRN: [id number]  HPI  Vanessa Gardner is a very pleasant 55 y.o. female with a history of hypertension, hypothyroidism, prediabetes who presents today with a chief complaint of knee pain.  Her pain is located to the left anterior knee that occurred after falling 11 days ago. She was walking on the concrete from her car to the sidewalk, tripped over the curb, fell onto her left knee. Since then she developed a lot of bruising, swelling, and scabbed her knee.   She experiences pain with walking up and down stairs, decrease in ROM with flexion and extension due to pain. Overall she's feeling better and has noticed an increase in ROM. She continues to notice some swelling, the bruising is healing.   She's applied ice and elevated her leg. She's avoided strenuous activity such as exercise for now.     Review of Systems  Constitutional: Negative for fever.  Musculoskeletal: Positive for arthralgias and joint swelling.  Skin: Positive for color change. Negative for wound.         Past Medical History:  Diagnosis Date  . Allergy   . Chicken pox   . Essential hypertension   . GERD (gastroesophageal reflux disease)   . Graves disease   . Hypothyroidism     Social History   Socioeconomic History  . Marital status: Married    Spouse name: Not on file  . Number of children: Not on file  . Years of education: Not on file  . Highest education level: Not on file  Occupational History  . Not on file  Tobacco Use  . Smoking status: Never Smoker  . Smokeless tobacco: Never Used  Vaping Use  . Vaping Use: Never used  Substance and Sexual Activity  . Alcohol use: Yes    Comment: twice a month-wine  . Drug use: No  . Sexual activity: Not on file  Other Topics Concern  . Not on file  Social History Narrative   ** Merged History Encounter **       Married. 2 children. Works as a Arts development officer. Enjoys exercising, going to the movies, shopping.    Social Determinants of Health   Financial Resource Strain: Not on file  Food Insecurity: Not on file  Transportation Needs: Not on file  Physical Activity: Not on file  Stress: Not on file  Social Connections: Not on file  Intimate Partner Violence: Not on file    Past Surgical History:  Procedure Laterality Date  . EYE SURGERY Right 2013  . TUBAL LIGATION      Family History  Problem Relation Age of Onset  . Breast cancer Neg Hx   . Alcohol abuse Father   . Hypertension Father   . Cirrhosis Father   . Hypertension Mother   . Throat cancer Maternal Grandfather   . Colon cancer Neg Hx   . Stomach cancer Neg Hx   . Esophageal cancer Neg Hx     Allergies  Allergen Reactions  . Sulfa Antibiotics Anaphylaxis  . Cefuroxime Axetil     REACTION: swelling  . Norvasc [Amlodipine Besylate] Swelling    Current Outpatient Medications on File Prior to Visit  Medication Sig Dispense Refill  . cholecalciferol (VITAMIN D) 1000 units tablet Take 1,000 Units by mouth daily.    Marland Kitchen loratadine (CLARITIN) 10 MG tablet Take 10 mg by mouth daily.    Marland Kitchen  losartan-hydrochlorothiazide (HYZAAR) 50-12.5 MG tablet Take 1 tablet by mouth daily. For blood pressure. 90 tablet 3  . sertraline (ZOLOFT) 25 MG tablet Take 1 tablet (25 mg total) by mouth daily. For anxiety. 90 tablet 3  . SYNTHROID 125 MCG tablet TAKE 1 TABLET BY MOUTH EVERY DAY 90 tablet 2   Current Facility-Administered Medications on File Prior to Visit  Medication Dose Route Frequency Provider Last Rate Last Admin  . 0.9 %  sodium chloride infusion  500 mL Intravenous Once Armbruster, Willaim Rayas, MD        BP 122/70 (BP Location: Left Arm, Patient Position: Sitting, Cuff Size: Large)   Pulse (!) 53   Temp 97.7 F (36.5 C) (Temporal)   Ht 5\' 4"  (1.626 m)   Wt 256 lb (116.1 kg)   LMP 07/24/2017   SpO2 100%   BMI 43.94 kg/m  Objective:   Physical Exam Pulmonary:      Effort: Pulmonary effort is normal.  Musculoskeletal:       Legs:     Comments: Slight decrease in ROM with extension and flexion, able to get up and down from exam table, mild limp. No patellar instability felt on exam.   Skin:    General: Skin is warm and dry.     Findings: Bruising present.     Comments: Mild swelling to anterior knee, scabbing noted on patella, bruising (dark blue and light green) surrounding knee on each side.  Neurological:     Mental Status: She is alert.           Assessment & Plan:      This visit occurred during the SARS-CoV-2 public health emergency.  Safety protocols were in place, including screening questions prior to the visit, additional usage of staff PPE, and extensive cleaning of exam room while observing appropriate contact time as indicated for disinfecting solutions.

## 2020-07-17 NOTE — Patient Instructions (Signed)
Complete xray(s) prior to leaving today. I will notify you of your results once received.  Continue to ice and elevate your knee.  You can take some Aleve or Ibuprofen to help with swelling and pain.  It was a pleasure to see you today!

## 2020-10-07 ENCOUNTER — Other Ambulatory Visit: Payer: Self-pay | Admitting: Primary Care

## 2020-10-07 DIAGNOSIS — R7303 Prediabetes: Secondary | ICD-10-CM

## 2020-10-21 ENCOUNTER — Other Ambulatory Visit: Payer: Self-pay

## 2020-10-21 ENCOUNTER — Other Ambulatory Visit (INDEPENDENT_AMBULATORY_CARE_PROVIDER_SITE_OTHER): Payer: Managed Care, Other (non HMO)

## 2020-10-21 DIAGNOSIS — R7303 Prediabetes: Secondary | ICD-10-CM | POA: Diagnosis not present

## 2020-10-21 LAB — POCT GLYCOSYLATED HEMOGLOBIN (HGB A1C): Hemoglobin A1C: 5.5 % (ref 4.0–5.6)

## 2020-12-21 ENCOUNTER — Telehealth (INDEPENDENT_AMBULATORY_CARE_PROVIDER_SITE_OTHER): Payer: Managed Care, Other (non HMO) | Admitting: Nurse Practitioner

## 2020-12-21 DIAGNOSIS — J069 Acute upper respiratory infection, unspecified: Secondary | ICD-10-CM | POA: Diagnosis not present

## 2020-12-21 DIAGNOSIS — R062 Wheezing: Secondary | ICD-10-CM | POA: Insufficient documentation

## 2020-12-21 MED ORDER — ALBUTEROL SULFATE HFA 108 (90 BASE) MCG/ACT IN AERS: 2.0000 | INHALATION_SPRAY | Freq: Four times a day (QID) | RESPIRATORY_TRACT | 0 refills | Status: DC | PRN

## 2020-12-21 MED ORDER — FLUTICASONE PROPIONATE 50 MCG/ACT NA SUSP: 2.0000 | Freq: Every day | NASAL | 0 refills | Status: DC

## 2020-12-21 NOTE — Assessment & Plan Note (Addendum)
Symptoms going on for days.  She has tested herself twice for COVID at home both test were negative.  Patient having sinus pressure but no tenderness per patient report.  Sounds likely be an upper respiratory infection.  Patient does not have a large history of strep throat nor sinusitis.  Discussed with patient we will hold off for the time being and if she does not improve over the next couple days she will send me a MyChart message and we can start antibiotics.  Patient acknowledged and understood.  Continue to monitor. She is already taking over-the-counter Claritin along with TheraFlu did recommend she can try Flonase if she needs additional relief.  Push fluids.  Discussed possible treatments with antibiotics if persists.  Patient does have sulfa and cefuroxime allergy.  States she is tolerated amoxicillin in the past.

## 2020-12-21 NOTE — Assessment & Plan Note (Signed)
Wheezing last night before bed.  No shortness of breath at this time.  Patient is speaking complete sentences without needing to break for breath.  We will send an albuterol inhaler in case she starts wheezing again or become short of breath.  Patient aware.

## 2020-12-21 NOTE — Progress Notes (Signed)
Patient ID: Vanessa Gardner, female    DOB: [birth year], 55 y.o.   MRN: [id number]  Virtual visit completed through [employer], a video enabled telemedicine application. Due to national recommendations of social distancing due to COVID-19, a virtual visit is felt to be most appropriate for this patient at this time. Reviewed limitations, risks, security and privacy concerns of performing a virtual visit and the availability of in person appointments. I also reviewed that there may be a patient responsible charge related to this service. The patient agreed to proceed.   Patient location: home Provider location: [place] at [facility], office Persons participating in this virtual visit: patient, provider   If any vitals were documented, they were collected by patient at home unless specified below.    LMP 07/24/2017    CC: Headache, Subjective:   HPI: Vanessa Gardner is a 55 y.o. female presenting on 12/21/2020 for Nasal Congestion (Sx started on 12/17/20. Slight cough, wheezing in the chest, chest congestion, chills, sinus pressure and pain.)   Symptoms started on 12/17/2020 Tested for Covid on 12/21/2020 and 12/19/2020. They were negative Vaccinated against Covid 19 with pfizer x2 and a booster  Thera flu OTC along with her Claritin.  Symptoms started improving but them have settled back in. States her sore throat has improved.   Relevant past medical, surgical, family and social history reviewed and updated as indicated. Interim medical history since our last visit reviewed. Allergies and medications reviewed and updated. Outpatient Medications Prior to Visit  Medication Sig Dispense Refill   cholecalciferol (VITAMIN D) 1000 units tablet Take 1,000 Units by mouth daily.     loratadine (CLARITIN) 10 MG tablet Take 10 mg by mouth daily.     losartan-hydrochlorothiazide (HYZAAR) 50-12.5 MG tablet Take 1 tablet by mouth daily. For blood pressure. 90 tablet 3   sertraline (ZOLOFT)  25 MG tablet Take 1 tablet (25 mg total) by mouth daily. For anxiety. 90 tablet 3   SYNTHROID 125 MCG tablet TAKE 1 TABLET BY MOUTH EVERY DAY 90 tablet 2   Facility-Administered Medications Prior to Visit  Medication Dose Route Frequency Provider Last Rate Last Admin   0.9 %  sodium chloride infusion  500 mL Intravenous Once Armbruster, Willaim Rayas, MD         Per HPI unless specifically indicated in ROS section below Review of Systems  Constitutional:  Positive for chills. Negative for fatigue and fever.  HENT:  Positive for congestion and sinus pressure. Negative for ear discharge, ear pain, postnasal drip, sinus pain and sore throat.   Respiratory:  Positive for cough and wheezing. Negative for shortness of breath.   Cardiovascular:  Negative for chest pain.  Gastrointestinal:  Positive for diarrhea. Negative for constipation, nausea and vomiting.  Musculoskeletal:  Negative for arthralgias and myalgias.  Neurological:  Positive for headaches.  Objective:  LMP 07/24/2017   Wt Readings from Last 3 Encounters:  07/17/20 256 lb (116.1 kg)  04/23/20 260 lb 6.4 oz (118.1 kg)  07/31/19 251 lb 12 oz (114.2 kg)       Physical exam: Gen: alert, NAD, not ill appearing Pulm: speaks in complete sentences without increased work of breathing Psych: normal mood, normal thought content      Results for orders placed or performed in visit on 10/21/20  POCT glycosylated hemoglobin (Hb A1C)  Result Value Ref Range   Hemoglobin A1C 5.5 4.0 - 5.6 %   HbA1c POC (<> result, manual entry)  HbA1c, POC (prediabetic range)     HbA1c, POC (controlled diabetic range)     Assessment & Plan:   Problem List Items Addressed This Visit       Respiratory   Viral upper respiratory tract infection - Primary    Symptoms going on for days.  She has tested herself twice for COVID at home both test were negative.  Patient having sinus pressure but no tenderness per patient report.  Sounds likely be an upper  respiratory infection.  Patient does not have a large history of strep throat nor sinusitis.  Discussed with patient we will hold off for the time being and if she does not improve over the next couple days she will send me a MyChart message and we can start antibiotics.  Patient acknowledged and understood.  Continue to monitor. She is already taking over-the-counter Claritin along with TheraFlu did recommend she can try Flonase if she needs additional relief.  Push fluids.      Relevant Medications   fluticasone (FLONASE) 50 MCG/ACT nasal spray     Other   Wheezing    Wheezing last night before bed.  No shortness of breath at this time.  Patient is speaking complete sentences without needing to break for breath.  We will send an albuterol inhaler in case she starts wheezing again or become short of breath.  Patient aware.      Relevant Medications   albuterol (VENTOLIN HFA) 108 (90 Base) MCG/ACT inhaler     No orders of the defined types were placed in this encounter.  No orders of the defined types were placed in this encounter.   I discussed the assessment and treatment plan with the patient. The patient was provided an opportunity to ask questions and all were answered. The patient agreed with the plan and demonstrated an understanding of the instructions. The patient was advised to call back or seek an in-person evaluation if the symptoms worsen or if the condition fails to improve as anticipated.  Follow up plan: Return if symptoms worsen or fail to improve.  Patient will reach out via my chart in approx 2 days if she is not improving and we will start abx.  Audria Nine, NP

## 2020-12-21 NOTE — Patient Instructions (Signed)
Nice to see you today!

## 2020-12-22 ENCOUNTER — Telehealth: Payer: Self-pay | Admitting: Primary Care

## 2020-12-22 ENCOUNTER — Other Ambulatory Visit: Payer: Self-pay | Admitting: Nurse Practitioner

## 2020-12-22 ENCOUNTER — Telehealth: Payer: Managed Care, Other (non HMO) | Admitting: Nurse Practitioner

## 2020-12-22 MED ORDER — AMOXICILLIN 500 MG PO CAPS: 500.0000 mg | ORAL_CAPSULE | Freq: Three times a day (TID) | ORAL | 0 refills | Status: AC

## 2020-12-22 NOTE — Telephone Encounter (Signed)
Pt called stating that she still not feeling well and having drainage.Pt asking if you would call in some amoxicillin at [facility] pharmacy in [place].

## 2020-12-22 NOTE — Telephone Encounter (Signed)
Patient advised.

## 2020-12-29 ENCOUNTER — Other Ambulatory Visit: Payer: Self-pay | Admitting: Primary Care

## 2020-12-29 DIAGNOSIS — E039 Hypothyroidism, unspecified: Secondary | ICD-10-CM

## 2021-03-28 ENCOUNTER — Other Ambulatory Visit: Payer: Self-pay | Admitting: Primary Care

## 2021-03-28 DIAGNOSIS — E039 Hypothyroidism, unspecified: Secondary | ICD-10-CM

## 2021-03-28 DIAGNOSIS — I10 Essential (primary) hypertension: Secondary | ICD-10-CM

## 2021-03-28 NOTE — Telephone Encounter (Signed)
Send letter. Due for CPE in February 2023.

## 2021-04-25 ENCOUNTER — Other Ambulatory Visit: Payer: Self-pay | Admitting: Primary Care

## 2021-04-25 DIAGNOSIS — I10 Essential (primary) hypertension: Secondary | ICD-10-CM

## 2021-04-27 ENCOUNTER — Encounter: Payer: Self-pay | Admitting: Primary Care

## 2021-04-27 ENCOUNTER — Other Ambulatory Visit: Payer: Self-pay

## 2021-04-27 ENCOUNTER — Other Ambulatory Visit (HOSPITAL_COMMUNITY)
Admission: RE | Admit: 2021-04-27 | Discharge: 2021-04-27 | Disposition: A | Discharge status: Home / Self Care | Payer: Managed Care, Other (non HMO) | Source: Ambulatory Visit | Attending: Primary Care | Admitting: Primary Care

## 2021-04-27 ENCOUNTER — Ambulatory Visit (INDEPENDENT_AMBULATORY_CARE_PROVIDER_SITE_OTHER): Payer: Managed Care, Other (non HMO) | Admitting: Primary Care

## 2021-04-27 VITALS — BP 110/60 | HR 66 | Temp 97.7°F | Resp 18 | Ht 64.0 in | Wt 260.6 lb

## 2021-04-27 DIAGNOSIS — E039 Hypothyroidism, unspecified: Secondary | ICD-10-CM | POA: Diagnosis not present

## 2021-04-27 DIAGNOSIS — Z124 Encounter for screening for malignant neoplasm of cervix: Secondary | ICD-10-CM

## 2021-04-27 DIAGNOSIS — I10 Essential (primary) hypertension: Secondary | ICD-10-CM | POA: Diagnosis not present

## 2021-04-27 DIAGNOSIS — Z0000 Encounter for general adult medical examination without abnormal findings: Secondary | ICD-10-CM | POA: Diagnosis not present

## 2021-04-27 DIAGNOSIS — Z1231 Encounter for screening mammogram for malignant neoplasm of breast: Secondary | ICD-10-CM

## 2021-04-27 DIAGNOSIS — R7303 Prediabetes: Secondary | ICD-10-CM | POA: Diagnosis not present

## 2021-04-27 DIAGNOSIS — F411 Generalized anxiety disorder: Secondary | ICD-10-CM

## 2021-04-27 LAB — COMPREHENSIVE METABOLIC PANEL
ALT: 14 U/L (ref 0–35)
AST: 21 U/L (ref 0–37)
Albumin: 4.2 g/dL (ref 3.5–5.2)
Alkaline Phosphatase: 89 U/L (ref 39–117)
BUN: 18 mg/dL (ref 6–23)
CO2: 37 mEq/L — ABNORMAL HIGH (ref 19–32)
Calcium: 9.3 mg/dL (ref 8.4–10.5)
Chloride: 99 mEq/L (ref 96–112)
Creatinine, Ser: 0.93 mg/dL (ref 0.40–1.20)
GFR: 68.56 mL/min (ref >60.00)
Glucose, Bld: 89 mg/dL (ref 70–99)
Potassium: 3.5 mEq/L (ref 3.5–5.1)
Sodium: 138 mEq/L (ref 135–145)
Total Bilirubin: 0.4 mg/dL (ref 0.2–1.2)
Total Protein: 7.4 g/dL (ref 6.0–8.3)

## 2021-04-27 LAB — CBC
HCT: 40.4 % (ref 36.0–46.0)
Hemoglobin: 13.3 g/dL (ref 12.0–15.0)
MCHC: 33 g/dL (ref 30.0–36.0)
MCV: 95.1 fl (ref 78.0–100.0)
Platelets: 241 10*3/uL (ref 150.0–400.0)
RBC: 4.25 Mil/uL (ref 3.87–5.11)
RDW: 13.5 % (ref 11.5–15.5)
WBC: 4.5 10*3/uL (ref 4.0–10.5)

## 2021-04-27 LAB — LIPID PANEL
Cholesterol: 218 mg/dL — ABNORMAL HIGH (ref 0–200)
HDL: 55.7 mg/dL (ref >39.00)
LDL Cholesterol: 147 mg/dL — ABNORMAL HIGH (ref 0–99)
NonHDL: 162.65
Total CHOL/HDL Ratio: 4
Triglycerides: 80 mg/dL (ref 0.0–149.0)
VLDL: 16 mg/dL (ref 0.0–40.0)

## 2021-04-27 LAB — TSH: TSH: 1.03 u[IU]/mL (ref 0.35–5.50)

## 2021-04-27 LAB — HEMOGLOBIN A1C: Hgb A1c MFr Bld: 5.9 % (ref 4.6–6.5)

## 2021-04-27 MED ORDER — SERTRALINE HCL 50 MG PO TABS
50.0000 mg | ORAL_TABLET | Freq: Every day | ORAL | 3 refills | Status: DC
Start: 2021-04-27 — End: 2021-07-12

## 2021-04-27 MED ORDER — SYNTHROID 125 MCG PO TABS: ORAL_TABLET | ORAL | 3 refills | Status: DC

## 2021-04-27 MED ORDER — LOSARTAN POTASSIUM-HCTZ 50-12.5 MG PO TABS: 1.0000 | ORAL_TABLET | Freq: Every day | ORAL | 3 refills | Status: DC

## 2021-04-27 NOTE — Assessment & Plan Note (Signed)
Immunizations UTD. Pap smear due, completed today. Colonoscopy UTD, due 2029. Mammogram due in March 2023, orders placed.  Discussed the importance of a healthy diet and regular exercise in order for weight loss, and to reduce the risk of further co-morbidity.  Exam today stable. Labs pending.

## 2021-04-27 NOTE — Patient Instructions (Signed)
Stop by the lab prior to leaving today. I will notify you of your results once received.   We increased the dose of your sertraline (Zoloft) for anxiety to 50 mg. I sent a new prescription to your pharmacy.   [facility].   Update me in 1 month if no improvement in anxiety.   It was a pleasure to see you today!  Preventive Care 5-57 Years Old, Female Preventive care refers to lifestyle choices and visits with your health care provider that can promote health and wellness. Preventive care visits are also called wellness exams. What can I expect for my preventive care visit? Counseling Your health care provider may ask you questions about your: Medical history, including: Past medical problems. Family medical history. Pregnancy history. Current health, including: Menstrual cycle. Method of birth control. Emotional well-being. Home life and relationship well-being. Sexual activity and sexual health. Lifestyle, including: Alcohol, nicotine or tobacco, and drug use. Access to firearms. Diet, exercise, and sleep habits. Work and work Statistician. Sunscreen use. Safety issues such as seatbelt and bike helmet use. Physical exam Your health care provider will check your: Height and weight. These may be used to calculate your BMI (body mass index). BMI is a measurement that tells if you are at a healthy weight. Waist circumference. This measures the distance around your waistline. This measurement also tells if you are at a healthy weight and may help predict your risk of certain diseases, such as type 2 diabetes and [place] blood pressure. Heart rate and blood pressure. Body temperature. Skin for abnormal spots. What immunizations do I need? Vaccines are usually given at various ages, according to a schedule. Your health care provider will recommend vaccines for you based on your age, medical history, and lifestyle or other factors, such as travel or  where you work. What tests do I need? Screening Your health care provider may recommend screening tests for certain conditions. This may include: Lipid and cholesterol levels. Diabetes screening. This is done by checking your blood sugar (glucose) after you have not eaten for a while (fasting). Pelvic exam and Pap test. Hepatitis B test. Hepatitis C test. HIV (human immunodeficiency virus) test. STI (sexually transmitted infection) testing, if you are at risk. Lung cancer screening. Colorectal cancer screening. Mammogram. Talk with your health care provider about when you should start having regular mammograms. This may depend on whether you have a family history of breast cancer. BRCA-related cancer screening. This may be done if you have a family history of breast, ovarian, tubal, or peritoneal cancers. Bone density scan. This is done to screen for osteoporosis. Talk with your health care provider about your test results, treatment options, and if necessary, the need for more tests. Follow these instructions at home: Eating and drinking  Eat a diet that includes fresh fruits and vegetables, whole grains, lean protein, and low-fat dairy products. Take vitamin and mineral supplements as recommended by your health care provider. Do not drink alcohol if: Your health care provider tells you not to drink. You are pregnant, may be pregnant, or are planning to become pregnant. If you drink alcohol: Limit how much you have to 0-1 drink a day. Know how much alcohol is in your drink. In the [place] one drink equals one 12 oz bottle of beer (355 mL), one 5 oz glass of wine (148 mL), or one 1 oz glass of hard liquor (44 mL). Lifestyle Brush your teeth every morning and night with  fluoride toothpaste. Floss one time each day. Exercise for at least 30 minutes 5 or more days each week. Do not use any products that contain nicotine or tobacco. These products include cigarettes, chewing tobacco, and  vaping devices, such as e-cigarettes. If you need help quitting, ask your health care provider. Do not use drugs. If you are sexually active, practice safe sex. Use a condom or other form of protection to prevent STIs. If you do not wish to become pregnant, use a form of birth control. If you plan to become pregnant, see your health care provider for a prepregnancy visit. Take aspirin only as told by your health care provider. Make sure that you understand how much to take and what form to take. Work with your health care provider to find out whether it is safe and beneficial for you to take aspirin daily. Find healthy ways to manage stress, such as: Meditation, yoga, or listening to music. Journaling. Talking to a trusted person. Spending time with friends and family. Minimize exposure to UV radiation to reduce your risk of skin cancer. Safety Always wear your seat belt while driving or riding in a vehicle. Do not drive: If you have been drinking alcohol. Do not ride with someone who has been drinking. When you are tired or distracted. While texting. If you have been using any mind-altering substances or drugs. Wear a helmet and other protective equipment during sports activities. If you have firearms in your house, make sure you follow all gun safety procedures. Seek help if you have been physically or sexually abused. What's next? Visit your health care provider once a year for an annual wellness visit. Ask your health care provider how often you should have your eyes and teeth checked. Stay up to date on all vaccines. This information is not intended to replace advice given to you by your health care provider. Make sure you discuss any questions you have with your health care provider. Document Revised: 09/02/2020 Document Reviewed: 09/02/2020 Elsevier Patient Education  [place].

## 2021-04-27 NOTE — Assessment & Plan Note (Signed)
She is taking Synthroid correctly.  Continue Synthroid 125 mcg daily. Repeat TSH pending.

## 2021-04-27 NOTE — Assessment & Plan Note (Signed)
Discussed the importance of a healthy diet and regular exercise in order for weight loss, and to reduce the risk of further co-morbidity. ? ?Repeat A1C pending. ?

## 2021-04-27 NOTE — Assessment & Plan Note (Signed)
Controlled.  Continue losartan-HCTZ 50-12.5 mg daily. CMP pending. 

## 2021-04-27 NOTE — Assessment & Plan Note (Signed)
Deteriorated.   Increase sertraline to 50 mg daily. She will update in 1 month if no improvement.

## 2021-04-27 NOTE — Progress Notes (Signed)
Subjective:    Patient ID: Vanessa Gardner, female    DOB: 27-Jul-1965, 56 y.o.   MRN: [id number]  HPI  Vanessa Gardner is a very pleasant 56 y.o. female who presents today for complete physical and follow up of chronic conditions.  She's not sure if sertraline 25 mg is effective. 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 She has been compliant to sertraline 25 mg daily which was effective when initially prescribed.   Immunizations: -Tetanus: 2017 -Influenza: Completed this season  -Covid-19: 4 vaccines -Shingles: Completed Shingrix  Diet: Fair diet.  Exercise: No regular exercise.  Eye exam: Completes annually  Dental exam: Completes semi-annually   Pap Smear: Completed in 2020, due today Mammogram: Completed in March 2022 Colonoscopy: Completed in 2019, due 2029  BP Readings from Last 3 Encounters:  04/27/21 110/60  07/17/20 122/70  04/23/20 122/70         Review of Systems  Constitutional:  Negative for unexpected weight change.  HENT:  Negative for rhinorrhea.   Eyes:  Negative for visual disturbance.  Respiratory:  Negative for shortness of breath.   Cardiovascular:  Negative for chest pain.  Gastrointestinal:  Negative for constipation and diarrhea.  Genitourinary:  Negative for difficulty urinating.  Musculoskeletal:  Negative for arthralgias and myalgias.  Skin:  Negative for rash.  Allergic/Immunologic: Positive for environmental allergies.  Neurological:  Negative for dizziness and headaches.  Psychiatric/Behavioral:  The patient is nervous/anxious.         Past Medical History:  Diagnosis Date   Allergy    Chicken pox    Essential hypertension    GERD (gastroesophageal reflux disease)    Graves disease    Hypothyroidism     Social History   Socioeconomic History   Marital status: Married    Spouse name: Not on file   Number of children: Not on file   Years of education: Not on file   Highest  education level: Not on file  Occupational History   Not on file  Tobacco Use   Smoking status: Never   Smokeless tobacco: Never  Vaping Use   Vaping Use: Never used  Substance and Sexual Activity   Alcohol use: Yes    Comment: twice a month-wine   Drug use: No   Sexual activity: Not on file  Other Topics Concern   Not on file  Social History Narrative   ** Merged History Encounter **       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 Enjoys exercising, going to the movies, shopping.    Social Determinants of Health   Financial Resource Strain: Not on file  Food Insecurity: Not on file  Transportation Needs: Not on file  Physical Activity: Not on file  Stress: Not on file  Social Connections: Not on file  Intimate Partner Violence: Not on file    Past Surgical History:  Procedure Laterality Date   EYE SURGERY Right 2013   TUBAL LIGATION      Family History  Problem Relation Age of Onset   Breast cancer Neg Hx    Alcohol abuse Father    Hypertension Father    Cirrhosis Father    Hypertension Mother    Throat cancer Maternal Grandfather    Colon cancer Neg Hx    Stomach cancer Neg Hx    Esophageal cancer Neg Hx     Allergies  Allergen Reactions   Sulfa Antibiotics Anaphylaxis   Cefuroxime Axetil  REACTION: swelling   Norvasc [Amlodipine Besylate] Swelling    Current Outpatient Medications on File Prior to Visit  Medication Sig Dispense Refill   albuterol (VENTOLIN HFA) 108 (90 Base) MCG/ACT inhaler Inhale 2 puffs into the lungs every 6 (six) hours as needed for wheezing or shortness of breath. 8 g 0   cholecalciferol (VITAMIN D) 1000 units tablet Take 1,000 Units by mouth daily.     fluticasone (FLONASE) 50 MCG/ACT nasal spray Place 2 sprays into both nostrils daily. 16 g 0   loratadine (CLARITIN) 10 MG tablet Take 10 mg by mouth daily.     losartan-hydrochlorothiazide (HYZAAR) 50-12.5 MG tablet Take 1 tablet by mouth daily. For blood pressure.  Office visit required for further refills. 30 tablet 0   sertraline (ZOLOFT) 25 MG tablet Take 1 tablet (25 mg total) by mouth daily. For anxiety. 90 tablet 3   SYNTHROID 125 MCG tablet Take 1 tablet by mouth every morning on an empty stomach with water only.  No food or other medications for 30 minutes. Office visit required for further refills. 30 tablet 0   Current Facility-Administered Medications on File Prior to Visit  Medication Dose Route Frequency Provider Last Rate Last Admin   0.9 %  sodium chloride infusion  500 mL Intravenous Once Armbruster, Willaim Rayas, MD        BP 110/60 (BP Location: Right Arm, Patient Position: Sitting, Cuff Size: Large)    Pulse 66    Temp 97.7 F (36.5 C) (Oral)    Resp 18    Ht 5\' 4"  (1.626 m)    Wt 260 lb 9.6 oz (118.2 kg)    LMP 07/24/2017    SpO2 98%    BMI 44.73 kg/m  Objective:   Physical Exam HENT:     Right Ear: Tympanic membrane and ear canal normal.     Left Ear: Tympanic membrane and ear canal normal.     Nose: Nose normal.  Eyes:     Conjunctiva/sclera: Conjunctivae normal.     Pupils: Pupils are equal, round, and reactive to light.  Neck:     Thyroid: No thyromegaly.  Cardiovascular:     Rate and Rhythm: Normal rate and regular rhythm.     Heart sounds: No murmur heard. Pulmonary:     Effort: Pulmonary effort is normal.     Breath sounds: Normal breath sounds. No rales.  Abdominal:     General: Bowel sounds are normal.     Palpations: Abdomen is soft.     Tenderness: There is no abdominal tenderness.  Genitourinary:    Labia:        Right: No tenderness or lesion.        Left: No tenderness or lesion.      Cervix: No cervical motion tenderness, discharge or erythema.     Uterus: Normal.      Adnexa: Right adnexa normal and left adnexa normal.  Musculoskeletal:        General: Normal range of motion.     Cervical back: Neck supple.  Lymphadenopathy:     Cervical: No cervical adenopathy.  Skin:    General: Skin is warm and  dry.     Findings: No rash.  Neurological:     Mental Status: She is alert and oriented to person, place, and time.     Cranial Nerves: No cranial nerve deficit.     Deep Tendon Reflexes: Reflexes are normal and symmetric.  Psychiatric:  Mood and Affect: Mood normal.          Assessment & Plan:      This visit occurred during the SARS-CoV-2 public health emergency.  Safety protocols were in place, including screening questions prior to the visit, additional usage of staff PPE, and extensive cleaning of exam room while observing appropriate contact time as indicated for disinfecting solutions.

## 2021-04-28 LAB — CYTOLOGY - PAP
Adequacy: ABSENT
Comment: NEGATIVE
Diagnosis: NEGATIVE
High risk HPV: NEGATIVE

## 2021-07-12 ENCOUNTER — Other Ambulatory Visit: Payer: Self-pay | Admitting: Primary Care

## 2021-07-12 DIAGNOSIS — F411 Generalized anxiety disorder: Secondary | ICD-10-CM

## 2021-07-23 ENCOUNTER — Ambulatory Visit: Payer: Managed Care, Other (non HMO) | Admitting: Primary Care

## 2021-07-23 ENCOUNTER — Encounter: Payer: Self-pay | Admitting: Primary Care

## 2021-07-23 DIAGNOSIS — R7303 Prediabetes: Secondary | ICD-10-CM

## 2021-07-23 NOTE — Assessment & Plan Note (Signed)
Long discussion today going over her diet and exercise routines.  Commended her on a healthy diet and regular exercise. ? ?Encouraged to increase water intake. ? ?We also talked about other weight loss options such as [facility], healthy weight and wellness center in [place]. ? ?She will contact her insurance company to see if they will cover Ozempic or Wegovy.  She will update. ?

## 2021-07-23 NOTE — Progress Notes (Signed)
? ?Subjective:  ? ? Patient ID: Vanessa Gardner, female    DOB: December 06, 1965, 56 y.o.   MRN: [id number] ? ?HPI ? ?Vanessa Gardner is a very pleasant 56 y.o. female with a history of hypertension, hypothyroidism, morbid obesity prediabetes who presents today to discuss morbid obesity. ? ?Long history of morbid obesity and is interested in weight loss medication, specifically the once weekly injections.  ? ?She has tried exercising and eating a healthy diet. For exercise she participates boot camp two to three days weekly and participates in kick boxing two days weekly.  ? ?She has tried weight watchers and was successful, did better with in person visits, cannot make in person visits due to work.  ? ?She's never tried Noom, Atkins, slim fast, low carb, or calorie counting. ? ?Diet currently consists of: ? ?Breakfast: 2 boiled eggs, [place] bacon  ?Lunch: Salad, chicken, veggies  ?Dinner: fish, chicken, veggies, salads  ?Snacks: yogurt, popcorn  ?Desserts: infrequent ice cream  ?Beverages: little water, coffee, occasional diet soda. ? ? ? ?Wt Readings from Last 3 Encounters:  ?07/23/21 257 lb 9.6 oz (116.8 kg)  ?04/27/21 260 lb 9.6 oz (118.2 kg)  ?07/17/20 256 lb (116.1 kg)  ? ?BP Readings from Last 3 Encounters:  ?07/23/21 118/66  ?04/27/21 110/60  ?07/17/20 122/70  ? ? ? ? ?Review of Systems  ?Respiratory:  Negative for shortness of breath.   ?Cardiovascular:  Negative for chest pain.  ?Neurological:  Negative for headaches.  ? ?   ? ? ?Past Medical History:  ?Diagnosis Date  ? Allergy   ? Chicken pox   ? Essential hypertension   ? GERD (gastroesophageal reflux disease)   ? Graves disease   ? Hypothyroidism   ? ? ?Social History  ? ?Socioeconomic History  ? Marital status: Married  ?  Spouse name: Not on file  ? Number of children: Not on file  ? Years of education: Not on file  ? Highest education level: Not on file  ?Occupational History  ? Not on file  ?Tobacco Use  ? Smoking status: Never  ? Smokeless tobacco:  Never  ?Vaping Use  ? Vaping Use: Never used  ?Substance and Sexual Activity  ? Alcohol use: Yes  ?  Comment: twice a month-wine  ? Drug use: No  ? Sexual activity: Not on file  ?Other Topics Concern  ? Not on file  ?Social History Narrative  ? ** Merged History Encounter **  ?    ? Married. ?2 children. ?Works as a Child psychotherapist. ?Enjoys exercising, going to the movies, shopping.   ? ?Social Determinants of Health  ? ?Financial Resource Strain: Not on file  ?Food Insecurity: Not on file  ?Transportation Needs: Not on file  ?Physical Activity: Not on file  ?Stress: Not on file  ?Social Connections: Not on file  ?Intimate Partner Violence: Not on file  ? ? ?Past Surgical History:  ?Procedure Laterality Date  ? EYE SURGERY Right 2013  ? TUBAL LIGATION    ? ? ?Family History  ?Problem Relation Age of Onset  ? Breast cancer Neg Hx   ? Alcohol abuse Father   ? Hypertension Father   ? Cirrhosis Father   ? Hypertension Mother   ? Throat cancer Maternal Grandfather   ? Colon cancer Neg Hx   ? Stomach cancer Neg Hx   ? Esophageal cancer Neg Hx   ? ? ?Allergies  ?Allergen Reactions  ? Sulfa Antibiotics Anaphylaxis  ? Cefuroxime  Axetil   ?  REACTION: swelling  ? Norvasc [Amlodipine Besylate] Swelling  ? ? ?Current Outpatient Medications on File Prior to Visit  ?Medication Sig Dispense Refill  ? cholecalciferol (VITAMIN D) 1000 units tablet Take 1,000 Units by mouth daily.    ? fluticasone (FLONASE) 50 MCG/ACT nasal spray Place 2 sprays into both nostrils daily. 16 g 0  ? loratadine (CLARITIN) 10 MG tablet Take 10 mg by mouth daily.    ? losartan-hydrochlorothiazide (HYZAAR) 50-12.5 MG tablet Take 1 tablet by mouth daily. For blood pressure. 90 tablet 3  ? sertraline (ZOLOFT) 50 MG tablet Take 1 tablet (50 mg total) by mouth daily. 90 tablet 0  ? SYNTHROID 125 MCG tablet Take 1 tablet by mouth every morning on an empty stomach with water only.  No food or other medications for 30 minutes. 90 tablet 3  ? ?Current  Facility-Administered Medications on File Prior to Visit  ?Medication Dose Route Frequency Provider Last Rate Last Admin  ? 0.9 %  sodium chloride infusion  500 mL Intravenous Once Armbruster, Carlota Raspberry, MD      ? ? ?BP 118/66   Pulse 63   Ht 5\' 4"  (1.626 m)   Wt 257 lb 9.6 oz (116.8 kg)   LMP 07/24/2017   SpO2 99%   BMI 44.22 kg/m?  ?Objective:  ? Physical Exam ?Cardiovascular:  ?   Rate and Rhythm: Normal rate and regular rhythm.  ?Pulmonary:  ?   Effort: Pulmonary effort is normal.  ?   Breath sounds: Normal breath sounds.  ?Musculoskeletal:  ?   Cervical back: Neck supple.  ?Skin: ?   General: Skin is warm and dry.  ? ? ? ? ? ?   ?Assessment & Plan:  ? ? ? ? ?This visit occurred during the SARS-CoV-2 public health emergency.  Safety protocols were in place, including screening questions prior to the visit, additional usage of staff PPE, and extensive cleaning of exam room while observing appropriate contact time as indicated for disinfecting solutions.  ?

## 2021-07-23 NOTE — Patient Instructions (Signed)
Consider looking into [facility], healthy weight and wellness center in [place]. ? ?Call your insurance company to see if they will cover Raiford Noble, [place] for weight loss. ? ?It was a pleasure to see you today! ? ? ?

## 2021-07-23 NOTE — Assessment & Plan Note (Signed)
Recent A1c of 5.9. ? ?Discussed the importance of a healthy diet and regular exercise in order for weight loss, and to reduce the risk of further co-morbidity. ? ?

## 2021-07-27 DIAGNOSIS — Z0289 Encounter for other administrative examinations: Secondary | ICD-10-CM

## 2021-09-02 ENCOUNTER — Ambulatory Visit (INDEPENDENT_AMBULATORY_CARE_PROVIDER_SITE_OTHER): Payer: Managed Care, Other (non HMO) | Admitting: Bariatrics

## 2021-09-02 ENCOUNTER — Encounter (INDEPENDENT_AMBULATORY_CARE_PROVIDER_SITE_OTHER): Payer: Self-pay | Admitting: Bariatrics

## 2021-09-02 VITALS — BP 109/70 | HR 60 | Temp 97.7°F | Ht 64.0 in | Wt 255.0 lb

## 2021-09-02 DIAGNOSIS — I10 Essential (primary) hypertension: Secondary | ICD-10-CM

## 2021-09-02 DIAGNOSIS — R7303 Prediabetes: Secondary | ICD-10-CM

## 2021-09-02 DIAGNOSIS — E038 Other specified hypothyroidism: Secondary | ICD-10-CM

## 2021-09-02 DIAGNOSIS — R0602 Shortness of breath: Secondary | ICD-10-CM

## 2021-09-02 DIAGNOSIS — Z1331 Encounter for screening for depression: Secondary | ICD-10-CM | POA: Diagnosis not present

## 2021-09-02 DIAGNOSIS — R5383 Other fatigue: Secondary | ICD-10-CM | POA: Diagnosis not present

## 2021-09-02 DIAGNOSIS — E559 Vitamin D deficiency, unspecified: Secondary | ICD-10-CM | POA: Diagnosis not present

## 2021-09-02 DIAGNOSIS — Z6841 Body Mass Index (BMI) 40.0 and over, adult: Secondary | ICD-10-CM

## 2021-09-02 DIAGNOSIS — E668 Other obesity: Secondary | ICD-10-CM

## 2021-09-03 LAB — COMPREHENSIVE METABOLIC PANEL
ALT: 16 IU/L (ref 0–32)
AST: 27 IU/L (ref 0–40)
Albumin/Globulin Ratio: 1.4 (ref 1.2–2.2)
Albumin: 4.2 g/dL (ref 3.8–4.9)
Alkaline Phosphatase: 102 IU/L (ref 44–121)
BUN/Creatinine Ratio: 19 (ref 9–23)
BUN: 15 mg/dL (ref 6–24)
Bilirubin Total: 0.3 mg/dL (ref 0.0–1.2)
CO2: 28 mmol/L (ref 20–29)
Calcium: 9 mg/dL (ref 8.7–10.2)
Chloride: 98 mmol/L (ref 96–106)
Creatinine, Ser: 0.79 mg/dL (ref 0.57–1.00)
Globulin, Total: 2.9 g/dL (ref 1.5–4.5)
Glucose: 92 mg/dL (ref 70–99)
Potassium: 3.6 mmol/L (ref 3.5–5.2)
Sodium: 140 mmol/L (ref 134–144)
Total Protein: 7.1 g/dL (ref 6.0–8.5)
eGFR: 87 mL/min/{1.73_m2} (ref >59)

## 2021-09-03 LAB — VITAMIN D 25 HYDROXY (VIT D DEFICIENCY, FRACTURES): Vit D, 25-Hydroxy: 44.7 ng/mL (ref 30.0–100.0)

## 2021-09-03 LAB — HEMOGLOBIN A1C
Est. average glucose Bld gHb Est-mCnc: 120 mg/dL
Hgb A1c MFr Bld: 5.8 % — ABNORMAL HIGH (ref 4.8–5.6)

## 2021-09-03 LAB — INSULIN, RANDOM: INSULIN: 15.7 u[IU]/mL (ref 2.6–24.9)

## 2021-09-06 NOTE — Progress Notes (Unsigned)
Chief Complaint:   OBESITY Vanessa Gardner (MR# [id number]) is a 56 y.o. female who presents for evaluation and treatment of obesity and related comorbidities. Current BMI is Body mass index is 43.77 kg/m. Vanessa Gardner has been struggling with her weight for many years and has been unsuccessful in either losing weight, maintaining weight loss, or reaching her healthy weight goal.  Vanessa Gardner likes to cook but her obstacles are fatigue and preparation. She sometimes skips meals.   Vanessa Gardner is currently in the action stage of change and ready to dedicate time achieving and maintaining a healthier weight. Vanessa Gardner is interested in becoming our patient and working on intensive lifestyle modifications including (but not limited to) diet and exercise for weight loss.  Vanessa Gardner's habits were reviewed today and are as follows: Her family eats meals together, she thinks her family will eat healthier with her, her desired weight loss is 90 lbs, she has been heavy most of her life, her heaviest weight ever was 267 pounds, she has significant food cravings issues, she snacks frequently in the evenings, she skips meals frequently, she is frequently drinking liquids with calories, she frequently eats larger portions than normal, and she struggles with emotional eating.  Depression Screen Vanessa Gardner's Food and Mood (modified PHQ-9) score was 5.     09/02/2021    8:35 AM  Depression screen PHQ 2/9  Decreased Interest 1  Down, Depressed, Hopeless 0  PHQ - 2 Score 1  Altered sleeping 1  Tired, decreased energy 1  Change in appetite 1  Feeling bad or failure about yourself  1  Trouble concentrating 0  Moving slowly or fidgety/restless 0  Suicidal thoughts 0  PHQ-9 Score 5  Difficult doing work/chores Not difficult at all   Subjective:   1. Other fatigue Vanessa Gardner admits to daytime somnolence and denies waking up still tired. Patient has a history of symptoms of daytime fatigue. Vanessa Gardner generally gets 7  hours of sleep per night, and states that she has generally restful sleep. Snoring is present. Apneic episodes are not present. Epworth Sleepiness Score is 2.   2. SOB (shortness of breath) on exertion Vanessa Gardner notes increasing shortness of breath with exercising and seems to be worsening over time with weight gain. She notes getting out of breath sooner with activity than she used to. This has not gotten worse recently. [name] denies shortness of breath at rest or orthopnea.  3. Other specified hypothyroidism Vanessa Gardner has a history of Graves' disease.  She is taking Synthroid currently.  4. Essential hypertension Vanessa Gardner takes losartan and her blood pressure is controlled.  5. Pre-diabetes Vanessa Gardner's last A1c was 5.9.  6. Vitamin D deficiency Vanessa Gardner is currently taking Vitamin D.  Assessment/Plan:   1. Other fatigue Vanessa Gardner does feel that her weight is causing her energy to be lower than it should be. Fatigue may be related to obesity, depression or many other causes. Labs will be ordered, and in the meanwhile, Vanessa Gardner and [place] will focus on self care including making healthy food choices, increasing physical activity and focusing on stress reduction.  - EKG 12-Lead  2. SOB (shortness of breath) on exertion Vanessa Gardner does feel that she gets out of breath more easily that she used to when she exercises. Vanessa Gardner's shortness of breath appears to be obesity related and exercise induced. She has agreed to work on weight loss and gradually increase exercise to treat her exercise induced shortness of breath. Will continue to monitor closely.  3. Other specified hypothyroidism We  will check labs today.  Vanessa Gardner will continue her medications.  - Hemoglobin A1c - Insulin, random - VITAMIN D 25 Hydroxy (Vit-D Deficiency, Fractures) - Comprehensive metabolic panel  4. Essential hypertension We will check labs today.  Vanessa Gardner will continue her medications.  5. Pre-diabetes We will check labs today.   Vanessa Gardner will decrease carbohydrates in her diet.  - Hemoglobin A1c - Insulin, random - Comprehensive metabolic panel  6. Vitamin D deficiency We will check labs today, and we will follow-up at Vanessa Gardner's next visit.   - VITAMIN D 25 Hydroxy (Vit-D Deficiency, Fractures)  7. Depression screening Vanessa Gardner had a positive depression screening. Depression is commonly associated with obesity and often results in emotional eating behaviors. We will monitor this closely and work on CBT to help improve the non-hunger eating patterns. Referral to Psychology may be required if no improvement is seen as she continues in our clinic.  8. Class 3 severe obesity with serious comorbidity and body mass index (BMI) of 40.0 to 44.9 in adult, unspecified obesity type (HCC) Vanessa Gardner is currently in the action stage of change and her goal is to continue with weight loss efforts. I recommend Vanessa Gardner begin the structured treatment plan as follows:  She has agreed to the Category 2 Plan + 100 calories.  Meal planning was discussed.  Reviewed labs from 04/27/2021, CMP, lipid, CBC, A1c, TSH.  Exercise goals: No exercise has been prescribed at this time.   Behavioral modification strategies: increasing lean protein intake, decreasing simple carbohydrates, increasing vegetables, increasing water intake, decreasing eating out, no skipping meals, meal planning and cooking strategies, keeping healthy foods in the home, and planning for success.  She was informed of the importance of frequent follow-up visits to maximize her success with intensive lifestyle modifications for her multiple health conditions. She was informed we would discuss her lab results at her next visit unless there is a critical issue that needs to be addressed sooner. Vanessa Gardner agreed to keep her next visit at the agreed upon time to discuss these results.  Objective:   Blood pressure 109/70, pulse 60, temperature 97.7 F (36.5 C), height 5\' 4"  (1.626 m),  weight 255 lb (115.7 kg), last menstrual period 07/24/2017, SpO2 95 %. Body mass index is 43.77 kg/m.  EKG: Normal sinus rhythm, rate 54 BPM (bradycardia).  Indirect Calorimeter completed today shows a VO2 of 270 and a REE of 1858.  Her calculated basal metabolic rate is 09/23/2017 thus her basal metabolic rate is worse than expected.  General: Cooperative, alert, well developed, in no acute distress. HEENT: Conjunctivae and lids unremarkable. Cardiovascular: Regular rhythm.  Lungs: Normal work of breathing. Neurologic: No focal deficits.   Lab Results  Component Value Date   CREATININE 0.79 09/02/2021   BUN 15 09/02/2021   NA 140 09/02/2021   K 3.6 09/02/2021   CL 98 09/02/2021   CO2 28 09/02/2021   Lab Results  Component Value Date   ALT 16 09/02/2021   AST 27 09/02/2021   ALKPHOS 102 09/02/2021   BILITOT 0.3 09/02/2021   Lab Results  Component Value Date   HGBA1C 5.8 (H) 09/02/2021   HGBA1C 5.9 04/27/2021   HGBA1C 5.5 10/21/2020   HGBA1C 5.9 04/21/2020   HGBA1C 5.8 10/22/2019   Lab Results  Component Value Date   INSULIN 15.7 09/02/2021   Lab Results  Component Value Date   TSH 1.03 04/27/2021   Lab Results  Component Value Date   CHOL 218 (H) 04/27/2021   HDL  55.70 04/27/2021   LDLCALC 147 (H) 04/27/2021   TRIG 80.0 04/27/2021   CHOLHDL 4 04/27/2021   Lab Results  Component Value Date   WBC 4.5 04/27/2021   HGB 13.3 04/27/2021   HCT 40.4 04/27/2021   MCV 95.1 04/27/2021   PLT 241.0 04/27/2021   No results found for: "IRON", "TIBC", "FERRITIN"  Attestation Statements:   Reviewed by clinician on day of visit: allergies, medications, problem list, medical history, surgical history, family history, social history, and previous encounter notes.   Vanessa Gardner, am acting as Energy manager for [employer], DO.  I have reviewed the above documentation for accuracy and completeness, and I agree with the above. Vanessa Capra, DO

## 2021-09-07 ENCOUNTER — Encounter (INDEPENDENT_AMBULATORY_CARE_PROVIDER_SITE_OTHER): Payer: Self-pay | Admitting: Bariatrics

## 2021-09-16 ENCOUNTER — Ambulatory Visit (INDEPENDENT_AMBULATORY_CARE_PROVIDER_SITE_OTHER): Payer: Managed Care, Other (non HMO) | Admitting: Bariatrics

## 2021-09-16 ENCOUNTER — Encounter (INDEPENDENT_AMBULATORY_CARE_PROVIDER_SITE_OTHER): Payer: Self-pay | Admitting: Bariatrics

## 2021-09-16 VITALS — BP 122/62 | HR 61 | Temp 97.6°F | Ht 64.0 in | Wt 253.0 lb

## 2021-09-16 DIAGNOSIS — E559 Vitamin D deficiency, unspecified: Secondary | ICD-10-CM

## 2021-09-16 DIAGNOSIS — E669 Obesity, unspecified: Secondary | ICD-10-CM

## 2021-09-16 DIAGNOSIS — R7303 Prediabetes: Secondary | ICD-10-CM | POA: Diagnosis not present

## 2021-09-16 DIAGNOSIS — Z6841 Body Mass Index (BMI) 40.0 and over, adult: Secondary | ICD-10-CM | POA: Diagnosis not present

## 2021-09-20 NOTE — Progress Notes (Unsigned)
Chief Complaint:   OBESITY Vanessa Gardner is here to discuss her progress with her obesity treatment plan along with follow-up of her obesity related diagnoses. Vanessa Gardner is on the Category 2 Plan + 100 calories and states she is following her eating plan approximately 50% of the time. Vanessa Gardner states she is walking/kick boxing/boot camp for 45 minutes 3 times per week.  Today's visit was #: 2 Starting weight: 255 lbs Starting date: 09/02/2021 Today's weight: 253 lbs Today's date: 09/16/2021 Total lbs lost to date: 2 Total lbs lost since last in-office visit: 2  Interim History: Vanessa Gardner is down 2 pounds since her first visit.  She did better when she prepared.  She is drinking more water.  Subjective:   1. Pre-diabetes Vanessa Gardner's insulin level is 15.7 and A1c 5.8. She has a family history of diabetes mellitus.   2. Vitamin D deficiency Vanessa Gardner's Vitamin D level is currently controlled.   Assessment/Plan:   1. Pre-diabetes Handouts on prediabetes and insulin resistance were given to the patient today.  2. Vitamin D deficiency Vanessa Gardner will continue vitamin D 1000 units daily.  3. Obesity, Current BMI 43.5 Vanessa Gardner is currently in the action stage of change. As such, her goal is to continue with weight loss efforts. She has agreed to the Category 2 Plan + 100 calories.   Meal planning and intentional eating were discussed.  She will measure her meats.  Reviewed labs with the patient from 09/02/2021, CMP and vitamin D.  Exercise goals: As is.   Behavioral modification strategies: increasing lean protein intake, decreasing simple carbohydrates, increasing vegetables, increasing water intake, decreasing eating out, no skipping meals, meal planning and cooking strategies, keeping healthy foods in the home, and planning for success.  Vanessa Gardner has agreed to follow-up with our clinic in 2 weeks. She was informed of the importance of frequent follow-up visits to maximize her success with  intensive lifestyle modifications for her multiple health conditions.   Objective:   Blood pressure 122/62, pulse 61, temperature 97.6 F (36.4 C), height 5\' 4"  (1.626 m), weight 253 lb (114.8 kg), last menstrual period 07/24/2017, SpO2 94 %. Body mass index is 43.43 kg/m.  General: Cooperative, alert, well developed, in no acute distress. HEENT: Conjunctivae and lids unremarkable. Cardiovascular: Regular rhythm.  Lungs: Normal work of breathing. Neurologic: No focal deficits.   Lab Results  Component Value Date   CREATININE 0.79 09/02/2021   BUN 15 09/02/2021   NA 140 09/02/2021   K 3.6 09/02/2021   CL 98 09/02/2021   CO2 28 09/02/2021   Lab Results  Component Value Date   ALT 16 09/02/2021   AST 27 09/02/2021   ALKPHOS 102 09/02/2021   BILITOT 0.3 09/02/2021   Lab Results  Component Value Date   HGBA1C 5.8 (H) 09/02/2021   HGBA1C 5.9 04/27/2021   HGBA1C 5.5 10/21/2020   HGBA1C 5.9 04/21/2020   HGBA1C 5.8 10/22/2019   Lab Results  Component Value Date   INSULIN 15.7 09/02/2021   Lab Results  Component Value Date   TSH 1.03 04/27/2021   Lab Results  Component Value Date   CHOL 218 (H) 04/27/2021   HDL 55.70 04/27/2021   LDLCALC 147 (H) 04/27/2021   TRIG 80.0 04/27/2021   CHOLHDL 4 04/27/2021   Lab Results  Component Value Date   VD25OH 44.7 09/02/2021   VD25OH 35.70 09/25/2015   Lab Results  Component Value Date   WBC 4.5 04/27/2021   HGB 13.3 04/27/2021   HCT  40.4 04/27/2021   MCV 95.1 04/27/2021   PLT 241.0 04/27/2021   No results found for: "IRON", "TIBC", "FERRITIN"  Attestation Statements:   Reviewed by clinician on day of visit: allergies, medications, problem list, medical history, surgical history, family history, social history, and previous encounter notes.   Trude Mcburney, am acting as Energy manager for [employer], DO.  I have reviewed the above documentation for accuracy and completeness, and I agree with the above. Corinna Capra, DO

## 2021-09-22 ENCOUNTER — Encounter (INDEPENDENT_AMBULATORY_CARE_PROVIDER_SITE_OTHER): Payer: Self-pay | Admitting: Bariatrics

## 2021-10-11 ENCOUNTER — Ambulatory Visit (INDEPENDENT_AMBULATORY_CARE_PROVIDER_SITE_OTHER): Payer: Managed Care, Other (non HMO) | Admitting: Nurse Practitioner

## 2021-10-19 ENCOUNTER — Ambulatory Visit (INDEPENDENT_AMBULATORY_CARE_PROVIDER_SITE_OTHER): Payer: Managed Care, Other (non HMO) | Admitting: Nurse Practitioner

## 2021-10-19 ENCOUNTER — Encounter (INDEPENDENT_AMBULATORY_CARE_PROVIDER_SITE_OTHER): Payer: Self-pay | Admitting: Nurse Practitioner

## 2021-10-19 VITALS — BP 124/67 | HR 57 | Temp 98.0°F | Ht 64.0 in | Wt 255.0 lb

## 2021-10-19 DIAGNOSIS — E669 Obesity, unspecified: Secondary | ICD-10-CM | POA: Diagnosis not present

## 2021-10-19 DIAGNOSIS — I10 Essential (primary) hypertension: Secondary | ICD-10-CM

## 2021-10-19 DIAGNOSIS — Z6841 Body Mass Index (BMI) 40.0 and over, adult: Secondary | ICD-10-CM | POA: Diagnosis not present

## 2021-10-20 NOTE — Progress Notes (Signed)
Chief Complaint:   OBESITY Vanessa Gardner is here to discuss her progress with her obesity treatment plan along with follow-up of her obesity related diagnoses. Vanessa Gardner is on the Category 2 Plan +100 and states she is following her eating plan approximately 45% of the time. Vanessa Gardner states she is doing [employer] camp and kickboxing 45-60 minutes 3 times per week.  Today's visit was #: 3 Starting weight: 255 lbs Starting date: 09/02/2021 Today's weight: 255 lbs Today's date: 10/19/2021 Total lbs lost to date: 0 lbs Total lbs lost since last in-office visit: 0  Interim History: Vanessa Gardner was seen here last on 09/16/21. Since her last visit she went to a conference in [place] and went to her [employer] school reunion. She notes that it is hard to meal prep due to job and working long hours. She does not feel that she is stress eating. She does not feel that she is eating enough but she is skipping meals. Drinking water and coffee.  Subjective:   1. Essential hypertension Vanessa Gardner is taking Hyzaar 50-12.5 mg. Her blood pressure looks good today. No complaints of any chest pain,shortness of breath or palpitations.  Assessment/Plan:   1. Essential hypertension Vanessa Gardner will continue to follow up with PCP and continue medications as directed.  Vanessa Gardner is working on healthy weight loss and exercise to improve blood pressure control. We will watch for signs of hypotension as she continues her lifestyle modifications.   2. Obesity, Current BMI 43.9 Vanessa Gardner is currently in the action stage of change. As such, her goal is to continue with weight loss efforts. She has agreed to the Category 2 Plan + 100.  Exercise goals: As is.  Behavioral modification strategies: increasing lean protein intake, increasing vegetables, increasing water intake, and meal planning and cooking strategies.  Vanessa Gardner has agreed to follow-up with our clinic in 2 weeks. She was informed of the importance of frequent follow-up visits  to maximize her success with intensive lifestyle modifications for her multiple health conditions.   Objective:   Blood pressure 124/67, pulse (!) 57, temperature 98 F (36.7 C), height 5\' 4"  (1.626 m), weight 255 lb (115.7 kg), last menstrual period 07/24/2017, SpO2 99 %. Body mass index is 43.77 kg/m.  General: Cooperative, alert, well developed, in no acute distress. HEENT: Conjunctivae and lids unremarkable. Cardiovascular: Regular rhythm.  Lungs: Normal work of breathing. Neurologic: No focal deficits.   Lab Results  Component Value Date   CREATININE 0.79 09/02/2021   BUN 15 09/02/2021   NA 140 09/02/2021   K 3.6 09/02/2021   CL 98 09/02/2021   CO2 28 09/02/2021   Lab Results  Component Value Date   ALT 16 09/02/2021   AST 27 09/02/2021   ALKPHOS 102 09/02/2021   BILITOT 0.3 09/02/2021   Lab Results  Component Value Date   HGBA1C 5.8 (H) 09/02/2021   HGBA1C 5.9 04/27/2021   HGBA1C 5.5 10/21/2020   HGBA1C 5.9 04/21/2020   HGBA1C 5.8 10/22/2019   Lab Results  Component Value Date   INSULIN 15.7 09/02/2021   Lab Results  Component Value Date   TSH 1.03 04/27/2021   Lab Results  Component Value Date   CHOL 218 (H) 04/27/2021   HDL 55.70 04/27/2021   LDLCALC 147 (H) 04/27/2021   TRIG 80.0 04/27/2021   CHOLHDL 4 04/27/2021   Lab Results  Component Value Date   VD25OH 44.7 09/02/2021   VD25OH 35.70 09/25/2015   Lab Results  Component Value Date  WBC 4.5 04/27/2021   HGB 13.3 04/27/2021   HCT 40.4 04/27/2021   MCV 95.1 04/27/2021   PLT 241.0 04/27/2021   No results found for: "IRON", "TIBC", "FERRITIN"  Attestation Statements:   Reviewed by clinician on day of visit: allergies, medications, problem list, medical history, surgical history, family history, social history, and previous encounter notes.  Time spent on visit including pre-visit chart review and post-visit care and charting was 30 minutes.   I, Brendell Tyus, RMA, am acting as  transcriptionist for Irene Limbo, FNP.  I have reviewed the above documentation for accuracy and completeness, and I agree with the above. Irene Limbo, FNP

## 2021-10-21 ENCOUNTER — Other Ambulatory Visit: Payer: Self-pay | Admitting: Family Medicine

## 2021-10-21 DIAGNOSIS — F411 Generalized anxiety disorder: Secondary | ICD-10-CM

## 2021-10-27 ENCOUNTER — Encounter (INDEPENDENT_AMBULATORY_CARE_PROVIDER_SITE_OTHER): Payer: Self-pay

## 2021-11-08 ENCOUNTER — Ambulatory Visit (INDEPENDENT_AMBULATORY_CARE_PROVIDER_SITE_OTHER): Payer: Managed Care, Other (non HMO) | Admitting: Family Medicine

## 2021-11-08 ENCOUNTER — Encounter (INDEPENDENT_AMBULATORY_CARE_PROVIDER_SITE_OTHER): Payer: Self-pay | Admitting: Family Medicine

## 2021-11-08 VITALS — BP 105/67 | HR 67 | Temp 97.9°F | Ht 64.0 in | Wt 251.0 lb

## 2021-11-08 DIAGNOSIS — Z6841 Body Mass Index (BMI) 40.0 and over, adult: Secondary | ICD-10-CM | POA: Diagnosis not present

## 2021-11-08 DIAGNOSIS — E669 Obesity, unspecified: Secondary | ICD-10-CM

## 2021-11-08 DIAGNOSIS — R7303 Prediabetes: Secondary | ICD-10-CM | POA: Diagnosis not present

## 2021-11-08 DIAGNOSIS — I10 Essential (primary) hypertension: Secondary | ICD-10-CM | POA: Diagnosis not present

## 2021-11-08 MED ORDER — METFORMIN HCL 500 MG PO TABS: 500.0000 mg | ORAL_TABLET | Freq: Every day | ORAL | 0 refills | Status: DC

## 2021-11-16 NOTE — Progress Notes (Signed)
Chief Complaint:   OBESITY Vanessa Gardner is here to discuss her progress with her obesity treatment plan along with follow-up of her obesity related diagnoses. Vanessa Gardner is on the Category 2 Plan + 100 calories and states she is following her eating plan approximately 70% of the time. Vanessa Gardner states she is doing Al intensity walking, and kick boxing for 45-75 minutes 3-4 times per week.  Today's visit was #: 4 Starting weight: 255 lbs Starting date: 09/02/2021 Today's weight: 251 lbs Today's date: 11/08/2021 Total lbs lost to date: 4 Total lbs lost since last in-office visit: 4  Interim History: Vanessa Gardner has done well with weight loss. She is working on increasing her water intake. Her hunger is mostly controlled and she is working on increasing lean protein. She notes she does better with weight loss when she increases water.   Subjective:   1. Prediabetes Vanessa Gardner is working on her diet and weight loss. She notes some mild polyphagia.  2. Essential hypertension Vanessa Gardner's blood pressure is well controlled. She is at Vanessa Gardner risk of hypotension with continued weight loss. She denies dizziness or lightheadedness currently.   Assessment/Plan:   1. Prediabetes Vanessa Gardner agreed to start metformin 500 mg daily with breakfast, with no refills. She will continue with her diet, exercise, and weight loss.   - metFORMIN (GLUCOPHAGE) 500 MG tablet; Take 1 tablet (500 mg total) by mouth daily with breakfast.  Dispense: 30 tablet; Refill: 0  2. Essential hypertension Vanessa Gardner will continue with her diet and exercise, and we will continue to monitor. She will increase her water intake.   3. Obesity, Current BMI 43.1 Vanessa Gardner is currently in the action stage of change. As such, her goal is to continue with weight loss efforts. She has agreed to the Category 2 Plan + 100 calories.   Exercise goals: As is.   Behavioral modification strategies: increasing lean protein intake, increasing water intake, and  meal planning and cooking strategies.  Vanessa Gardner has agreed to follow-up with our clinic in 2 to 3 weeks. She was informed of the importance of frequent follow-up visits to maximize her success with intensive lifestyle modifications for her multiple health conditions.   Objective:   Blood pressure 105/67, pulse 67, temperature 97.9 F (36.6 C), height 5\' 4"  (1.626 m), weight 251 lb (113.9 kg), last menstrual period 07/24/2017, SpO2 98 %. Body mass index is 43.08 kg/m.  General: Cooperative, alert, well developed, in no acute distress. HEENT: Conjunctivae and lids unremarkable. Cardiovascular: Regular rhythm.  Lungs: Normal work of breathing. Neurologic: No focal deficits.   Lab Results  Component Value Date   CREATININE 0.79 09/02/2021   BUN 15 09/02/2021   NA 140 09/02/2021   K 3.6 09/02/2021   CL 98 09/02/2021   CO2 28 09/02/2021   Lab Results  Component Value Date   ALT 16 09/02/2021   AST 27 09/02/2021   ALKPHOS 102 09/02/2021   BILITOT 0.3 09/02/2021   Lab Results  Component Value Date   HGBA1C 5.8 (H) 09/02/2021   HGBA1C 5.9 04/27/2021   HGBA1C 5.5 10/21/2020   HGBA1C 5.9 04/21/2020   HGBA1C 5.8 10/22/2019   Lab Results  Component Value Date   INSULIN 15.7 09/02/2021   Lab Results  Component Value Date   TSH 1.03 04/27/2021   Lab Results  Component Value Date   CHOL 218 (H) 04/27/2021   HDL 55.70 04/27/2021   LDLCALC 147 (H) 04/27/2021   TRIG 80.0 04/27/2021   CHOLHDL 4 04/27/2021  Lab Results  Component Value Date   VD25OH 44.7 09/02/2021   VD25OH 35.70 09/25/2015   Lab Results  Component Value Date   WBC 4.5 04/27/2021   HGB 13.3 04/27/2021   HCT 40.4 04/27/2021   MCV 95.1 04/27/2021   PLT 241.0 04/27/2021   No results found for: "IRON", "TIBC", "FERRITIN"  Attestation Statements:   Reviewed by clinician on day of visit: allergies, medications, problem list, medical history, surgical history, family history, social history, and previous  encounter notes.   I, Burt Knack, am acting as transcriptionist for Quillian Quince, MD.  I have reviewed the above documentation for accuracy and completeness, and I agree with the above. -  Quillian Quince, MD

## 2021-12-01 ENCOUNTER — Encounter (INDEPENDENT_AMBULATORY_CARE_PROVIDER_SITE_OTHER): Payer: Self-pay | Admitting: Adult Health

## 2021-12-01 ENCOUNTER — Ambulatory Visit (INDEPENDENT_AMBULATORY_CARE_PROVIDER_SITE_OTHER): Payer: Managed Care, Other (non HMO) | Admitting: Adult Health

## 2021-12-01 VITALS — BP 122/76 | HR 56 | Temp 97.5°F | Ht 64.0 in | Wt 253.0 lb

## 2021-12-01 DIAGNOSIS — Z6841 Body Mass Index (BMI) 40.0 and over, adult: Secondary | ICD-10-CM | POA: Diagnosis not present

## 2021-12-01 DIAGNOSIS — E669 Obesity, unspecified: Secondary | ICD-10-CM

## 2021-12-01 DIAGNOSIS — R7303 Prediabetes: Secondary | ICD-10-CM

## 2021-12-04 NOTE — Progress Notes (Unsigned)
Chief Complaint:   OBESITY Vanessa Gardner is here to discuss her progress with her obesity treatment plan along with follow-up of her obesity related diagnoses. Vanessa Gardner is on the Category 2 Plan  plus 100 calories and states she is following her eating plan approximately 50% of the time. Vanessa Gardner states she is has been on a cruise and averaged walking for 60 minutes 4 times per week.  Today's visit was #: 5 Starting weight: 255 lbs Starting date: 09/02/2021 Today's weight: 253 lbs Today's date: 12/01/2021 Total lbs lost to date: 2 lbs  Total lbs lost since last in-office visit: 0 lbs  Interim History: Vanessa Gardner has been on an eight day cruise and returned home on Sunday, 11/28/2021. While abroad, she remained well hydrated with water and avoided frequently snacking while eating lighter meals. Vanessa Gardner held Metformin while on the cruise and restarted on 11/29/2021.  Subjective:   1. Prediabetes Vanessa Gardner held Metformin while on her cruise, then restarted on 11/29/2021. She has experienced loose stools if she over eats or has too many sugars/carbohydrates.  Assessment/Plan:   1. Prediabetes Vanessa Gardner will continue taking Metformin 500 mg daily.  2. Obesity, Current BMI 62.3 Vanessa Gardner is currently in the action stage of change. As such, her goal is to continue with weight loss efforts. She has agreed to the Category 2 Plan plus 100 calories daily. Vanessa Gardner was given our 762/831 calorie snack hand out.  Exercise goals: As is.  Behavioral modification strategies: increasing lean protein intake, decreasing simple carbohydrates, meal planning and cooking strategies, keeping healthy foods in the home, and planning for success.  Vanessa Gardner has agreed to follow-up with our clinic in 2 weeks. She was informed of the importance of frequent follow-up visits to maximize her success with intensive lifestyle modifications for her multiple health conditions.   Objective:   Blood pressure 122/76, pulse (!) 56,  temperature (!) 97.5 F (36.4 C), height 5\' 4"  (1.626 m), weight 253 lb (114.8 kg), last menstrual period 07/24/2017, SpO2 98 %. Body mass index is 43.43 kg/m.  General: Cooperative, alert, well developed, in no acute distress. HEENT: Conjunctivae and lids unremarkable. Cardiovascular: Regular rhythm.  Lungs: Normal work of breathing. Neurologic: No focal deficits.   Lab Results  Component Value Date   CREATININE 0.79 09/02/2021   BUN 15 09/02/2021   NA 140 09/02/2021   K 3.6 09/02/2021   CL 98 09/02/2021   CO2 28 09/02/2021   Lab Results  Component Value Date   ALT 16 09/02/2021   AST 27 09/02/2021   ALKPHOS 102 09/02/2021   BILITOT 0.3 09/02/2021   Lab Results  Component Value Date   HGBA1C 5.8 (H) 09/02/2021   HGBA1C 5.9 04/27/2021   HGBA1C 5.5 10/21/2020   HGBA1C 5.9 04/21/2020   HGBA1C 5.8 10/22/2019   Lab Results  Component Value Date   INSULIN 15.7 09/02/2021   Lab Results  Component Value Date   TSH 1.03 04/27/2021   Lab Results  Component Value Date   CHOL 218 (H) 04/27/2021   HDL 55.70 04/27/2021   LDLCALC 147 (H) 04/27/2021   TRIG 80.0 04/27/2021   CHOLHDL 4 04/27/2021   Lab Results  Component Value Date   VD25OH 44.7 09/02/2021   VD25OH 35.70 09/25/2015   Lab Results  Component Value Date   WBC 4.5 04/27/2021   HGB 13.3 04/27/2021   HCT 40.4 04/27/2021   MCV 95.1 04/27/2021   PLT 241.0 04/27/2021   No results found for: "IRON", "TIBC", "FERRITIN"  Attestation Statements:   Reviewed by clinician on day of visit: allergies, medications, problem list, medical history, surgical history, family history, social history, and previous encounter notes.  Time spent on visit including pre-visit chart review and post-visit care and charting was 27 minutes.   I, Lennette Bihari, CMA, am acting as Location manager for Mina Marble, NP.   I have reviewed the above documentation for accuracy and completeness, and I agree with the above. -  ***

## 2021-12-05 ENCOUNTER — Other Ambulatory Visit (INDEPENDENT_AMBULATORY_CARE_PROVIDER_SITE_OTHER): Payer: Self-pay | Admitting: Family Medicine

## 2021-12-05 DIAGNOSIS — R7303 Prediabetes: Secondary | ICD-10-CM

## 2021-12-08 ENCOUNTER — Other Ambulatory Visit (INDEPENDENT_AMBULATORY_CARE_PROVIDER_SITE_OTHER): Payer: Self-pay | Admitting: Family Medicine

## 2021-12-08 DIAGNOSIS — R7303 Prediabetes: Secondary | ICD-10-CM

## 2021-12-08 MED ORDER — METFORMIN HCL 500 MG PO TABS: 500.0000 mg | ORAL_TABLET | Freq: Every day | ORAL | 0 refills | Status: DC

## 2021-12-08 NOTE — Telephone Encounter (Signed)
LAST APPOINTMENT DATE: 12/01/2021 NEXT APPOINTMENT DATE: 12/23/2021   [id number] - [place], [place] - [address] [place] [address] Phone: [phone] Fax: [phone]  Patient is requesting a refill of the following medications: Requested Prescriptions   Pending Prescriptions Disp Refills   metFORMIN (GLUCOPHAGE) 500 MG tablet 30 tablet 0    Sig: Take 1 tablet (500 mg total) by mouth daily with breakfast.    Date last filled: 11/08/2021 Previously prescribed by [facility]  Lab Results  Component Value Date   HGBA1C 5.8 (H) 09/02/2021   HGBA1C 5.9 04/27/2021   HGBA1C 5.5 10/21/2020   Lab Results  Component Value Date   LDLCALC 147 (H) 04/27/2021   CREATININE 0.79 09/02/2021   Lab Results  Component Value Date   VD25OH 44.7 09/02/2021   VD25OH 35.70 09/25/2015    BP Readings from Last 3 Encounters:  12/01/21 122/76  11/08/21 105/67  10/19/21 124/67

## 2021-12-23 ENCOUNTER — Encounter (INDEPENDENT_AMBULATORY_CARE_PROVIDER_SITE_OTHER): Payer: Self-pay | Admitting: Adult Health

## 2021-12-23 ENCOUNTER — Ambulatory Visit (INDEPENDENT_AMBULATORY_CARE_PROVIDER_SITE_OTHER): Payer: Managed Care, Other (non HMO) | Admitting: Adult Health

## 2021-12-23 VITALS — BP 121/69 | HR 65 | Temp 97.7°F | Ht 64.0 in | Wt 248.0 lb

## 2021-12-23 DIAGNOSIS — Z6841 Body Mass Index (BMI) 40.0 and over, adult: Secondary | ICD-10-CM

## 2021-12-23 DIAGNOSIS — E669 Obesity, unspecified: Secondary | ICD-10-CM

## 2021-12-23 DIAGNOSIS — R7303 Prediabetes: Secondary | ICD-10-CM

## 2021-12-23 MED ORDER — METFORMIN HCL 500 MG PO TABS: 500.0000 mg | ORAL_TABLET | Freq: Every day | ORAL | 0 refills | Status: DC

## 2022-01-03 NOTE — Progress Notes (Unsigned)
Chief Complaint:   OBESITY Vanessa Gardner is here to discuss her progress with her obesity treatment plan along with follow-up of her obesity related diagnoses. Vanessa Gardner is on the Category 2 Plan + 100 calories and states she is following her eating plan approximately 50% of the time. Vanessa Gardner states she is going to boot camp 60 minutes 3 times per week.  Today's visit was #: 6 Starting weight: 255 lbs Starting date: 09/02/2021 Today's weight: 248 lbs Today's date: 12/23/2021 Total lbs lost to date: 7 lbs Total lbs lost since last in-office visit: 5 lbs  Interim History: She is down 5 lbs since last office visit. She is going to boot camp at 0830, three days per week.  Those days, she consumes dinner at lunch time and lunch at dinner time. She is weight training and doing cardio alternating.    Subjective:   1. Prediabetes A1c *** She is currently on Metformin 500 mg daily.   Assessment/Plan:   1. Prediabetes Refill - metFORMIN (GLUCOPHAGE) 500 MG tablet; Take 1 tablet (500 mg total) by mouth daily with breakfast.  Dispense: 30 tablet; Refill: 0  2. Obesity, Current BMI 42.7 Goals:  1) Keep building muscle.  2) Keep steady loss.   Vanessa Gardner is currently in the action stage of change. As such, her goal is to continue with weight loss efforts. She has agreed to the Category 2 Plan +100 calories.   Exercise goals:  As is.   Behavioral modification strategies: increasing lean protein intake, decreasing simple carbohydrates, meal planning and cooking strategies, keeping healthy foods in the home, and planning for success.  Vanessa Gardner has agreed to follow-up with our clinic in 4 weeks. She was informed of the importance of frequent follow-up visits to maximize her success with intensive lifestyle modifications for her multiple health conditions.   Objective:   Blood pressure 121/69, pulse 65, temperature 97.7 F (36.5 C), height 5\' 4"  (1.626 m), weight 248 lb (112.5 kg), last menstrual  period 07/24/2017, SpO2 100 %. Body mass index is 42.57 kg/m.  General: Cooperative, alert, well developed, in no acute distress. HEENT: Conjunctivae and lids unremarkable. Cardiovascular: Regular rhythm.  Lungs: Normal work of breathing. Neurologic: No focal deficits.   Lab Results  Component Value Date   CREATININE 0.79 09/02/2021   BUN 15 09/02/2021   NA 140 09/02/2021   K 3.6 09/02/2021   CL 98 09/02/2021   CO2 28 09/02/2021   Lab Results  Component Value Date   ALT 16 09/02/2021   AST 27 09/02/2021   ALKPHOS 102 09/02/2021   BILITOT 0.3 09/02/2021   Lab Results  Component Value Date   HGBA1C 5.8 (H) 09/02/2021   HGBA1C 5.9 04/27/2021   HGBA1C 5.5 10/21/2020   HGBA1C 5.9 04/21/2020   HGBA1C 5.8 10/22/2019   Lab Results  Component Value Date   INSULIN 15.7 09/02/2021   Lab Results  Component Value Date   TSH 1.03 04/27/2021   Lab Results  Component Value Date   CHOL 218 (H) 04/27/2021   HDL 55.70 04/27/2021   LDLCALC 147 (H) 04/27/2021   TRIG 80.0 04/27/2021   CHOLHDL 4 04/27/2021   Lab Results  Component Value Date   VD25OH 44.7 09/02/2021   VD25OH 35.70 09/25/2015   Lab Results  Component Value Date   WBC 4.5 04/27/2021   HGB 13.3 04/27/2021   HCT 40.4 04/27/2021   MCV 95.1 04/27/2021   PLT 241.0 04/27/2021   No results found for: "IRON", "  TIBC", "FERRITIN"  Attestation Statements:   Reviewed by clinician on day of visit: allergies, medications, problem list, medical history, surgical history, family history, social history, and previous encounter notes.  I, Davy Pique, RMA, am acting as Location manager for Mina Marble, NP.  I have reviewed the above documentation for accuracy and completeness, and I agree with the above. -  ***

## 2022-01-13 ENCOUNTER — Ambulatory Visit (INDEPENDENT_AMBULATORY_CARE_PROVIDER_SITE_OTHER): Payer: Managed Care, Other (non HMO) | Admitting: Family Medicine

## 2022-01-17 ENCOUNTER — Ambulatory Visit (INDEPENDENT_AMBULATORY_CARE_PROVIDER_SITE_OTHER): Payer: Managed Care, Other (non HMO) | Admitting: Family Medicine

## 2022-01-17 ENCOUNTER — Encounter (INDEPENDENT_AMBULATORY_CARE_PROVIDER_SITE_OTHER): Payer: Self-pay | Admitting: Family Medicine

## 2022-01-17 VITALS — BP 128/75 | HR 61 | Temp 97.7°F | Ht 64.0 in | Wt 251.0 lb

## 2022-01-17 DIAGNOSIS — E669 Obesity, unspecified: Secondary | ICD-10-CM | POA: Diagnosis not present

## 2022-01-17 DIAGNOSIS — R7303 Prediabetes: Secondary | ICD-10-CM

## 2022-01-17 DIAGNOSIS — I10 Essential (primary) hypertension: Secondary | ICD-10-CM | POA: Diagnosis not present

## 2022-01-17 DIAGNOSIS — Z6841 Body Mass Index (BMI) 40.0 and over, adult: Secondary | ICD-10-CM

## 2022-01-17 MED ORDER — METFORMIN HCL 500 MG PO TABS: 500.0000 mg | ORAL_TABLET | Freq: Two times a day (BID) | ORAL | 0 refills | Status: DC

## 2022-01-26 NOTE — Progress Notes (Signed)
Chief Complaint:   OBESITY Vanessa Gardner is here to discuss her progress with her obesity treatment plan along with follow-up of her obesity related diagnoses. Vanessa Gardner is on the Category 2 Plan and states she is following her eating plan approximately 50% of the time. Vanessa Gardner states she is walking/boot camp 60 minutes 4 times per week.  Today's visit was #: 7 Starting weight: 255 lbs Starting date: 09/02/2021 Today's weight: 251 lbs Today's date: 01/17/2022 Total lbs lost to date: 4 lbs Total lbs lost since last in-office visit: 0  Interim History: This is [name] first office visit with me. Seen by [facility], Dr. Acie Fredrickson. "My life is too busy to eat on meal plan". Daughters bachelorette party over  weekend. "I tried [facility]/[place] plan". No upcoming celebrations or travel.  Subjective:   1. Prediabetes Vanessa Gardner is tolerating Metformin well. Been on it for 5 months and also she eats less and less snacking.   2. Essential hypertension Asymptomatic, tolerating medication well, compliance good.  On Hyzaar.  Assessment/Plan:  No orders of the defined types were placed in this encounter.   Medications Discontinued During This Encounter  Medication Reason   metFORMIN (GLUCOPHAGE) 500 MG tablet Reorder     Meds ordered this encounter  Medications   metFORMIN (GLUCOPHAGE) 500 MG tablet    Sig: Take 1 tablet (500 mg total) by mouth 2 (two) times daily with a meal.    Dispense:  60 tablet    Refill:  0     1. Prediabetes Increase/Refill Metformin 500 mg to twice a day in mornings and afternoons to help with hunger and cravings for 1 month with 0 refills. Eat all proteins and recheck labs at next office visit.  -Increase/Refill metFORMIN (GLUCOPHAGE) 500 MG tablet; Take 1 tablet (500 mg total) by mouth 2 (two) times daily with a meal.  Dispense: 60 tablet; Refill: 0  2. Essential hypertension Will recheck labs at next office visit.  3. Obesity,current BMI 43.2 Vanessa Gardner is currently in  the action stage of change. As such, her goal is to continue with weight loss efforts. She has agreed to Cat 2+100.  Get back to meal prep and planning for next office visit.   Exercise goals: As is.  Behavioral modification strategies: meal planning and cooking strategies.  Vanessa Gardner has agreed to follow-up with our clinic in 4 weeks. She was informed of the importance of frequent follow-up visits to maximize her success with intensive lifestyle modifications for her multiple health conditions.   Objective:   Blood pressure 128/75, pulse 61, temperature 97.7 F (36.5 C), height 5\' 4"  (1.626 m), weight 251 lb (113.9 kg), last menstrual period 07/24/2017, SpO2 98 %. Body mass index is 43.08 kg/m.  General: Cooperative, alert, well developed, in no acute distress. HEENT: Conjunctivae and lids unremarkable. Cardiovascular: Regular rhythm.  Lungs: Normal work of breathing. Neurologic: No focal deficits.   Lab Results  Component Value Date   CREATININE 0.79 09/02/2021   BUN 15 09/02/2021   NA 140 09/02/2021   K 3.6 09/02/2021   CL 98 09/02/2021   CO2 28 09/02/2021   Lab Results  Component Value Date   ALT 16 09/02/2021   AST 27 09/02/2021   ALKPHOS 102 09/02/2021   BILITOT 0.3 09/02/2021   Lab Results  Component Value Date   HGBA1C 5.8 (H) 09/02/2021   HGBA1C 5.9 04/27/2021   HGBA1C 5.5 10/21/2020   HGBA1C 5.9 04/21/2020   HGBA1C 5.8 10/22/2019   Lab Results  Component Value Date   INSULIN 15.7 09/02/2021   Lab Results  Component Value Date   TSH 1.03 04/27/2021   Lab Results  Component Value Date   CHOL 218 (H) 04/27/2021   HDL 55.70 04/27/2021   LDLCALC 147 (H) 04/27/2021   TRIG 80.0 04/27/2021   CHOLHDL 4 04/27/2021   Lab Results  Component Value Date   VD25OH 44.7 09/02/2021   VD25OH 35.70 09/25/2015   Lab Results  Component Value Date   WBC 4.5 04/27/2021   HGB 13.3 04/27/2021   HCT 40.4 04/27/2021   MCV 95.1 04/27/2021   PLT 241.0 04/27/2021    No results found for: "IRON", "TIBC", "FERRITIN"  Attestation Statements:   Reviewed by clinician on day of visit: allergies, medications, problem list, medical history, surgical history, family history, social history, and previous encounter notes.  I, Brendell Tyus, am acting as Energy manager for [employer], DO.   I have reviewed the above documentation for accuracy and completeness, and I agree with the above. Carlye Grippe, D.O.  The 21st Century Cures Act was signed into law in 2016 which includes the topic of electronic health records.  This provides immediate access to information in MyChart.  This includes consultation notes, operative notes, office notes, lab results and pathology reports.  If you have any questions about what you read please let us know at your next visit so we can discuss your concerns and take corrective action if need be.  We are right here with you.

## 2022-02-10 ENCOUNTER — Other Ambulatory Visit (INDEPENDENT_AMBULATORY_CARE_PROVIDER_SITE_OTHER): Payer: Self-pay | Admitting: Family Medicine

## 2022-02-10 DIAGNOSIS — R7303 Prediabetes: Secondary | ICD-10-CM

## 2022-02-16 ENCOUNTER — Ambulatory Visit (INDEPENDENT_AMBULATORY_CARE_PROVIDER_SITE_OTHER): Payer: Managed Care, Other (non HMO) | Admitting: Family Medicine

## 2022-03-01 ENCOUNTER — Encounter (INDEPENDENT_AMBULATORY_CARE_PROVIDER_SITE_OTHER): Payer: Self-pay | Admitting: Family Medicine

## 2022-03-01 ENCOUNTER — Ambulatory Visit (INDEPENDENT_AMBULATORY_CARE_PROVIDER_SITE_OTHER): Payer: Managed Care, Other (non HMO) | Admitting: Family Medicine

## 2022-03-01 VITALS — BP 127/74 | HR 58 | Temp 97.6°F | Ht 64.0 in | Wt 249.0 lb

## 2022-03-01 DIAGNOSIS — E669 Obesity, unspecified: Secondary | ICD-10-CM

## 2022-03-01 DIAGNOSIS — E559 Vitamin D deficiency, unspecified: Secondary | ICD-10-CM | POA: Diagnosis not present

## 2022-03-01 DIAGNOSIS — Z6841 Body Mass Index (BMI) 40.0 and over, adult: Secondary | ICD-10-CM

## 2022-03-01 DIAGNOSIS — R7303 Prediabetes: Secondary | ICD-10-CM | POA: Diagnosis not present

## 2022-03-01 DIAGNOSIS — I10 Essential (primary) hypertension: Secondary | ICD-10-CM

## 2022-03-01 DIAGNOSIS — E039 Hypothyroidism, unspecified: Secondary | ICD-10-CM

## 2022-03-01 DIAGNOSIS — E7849 Other hyperlipidemia: Secondary | ICD-10-CM

## 2022-03-01 DIAGNOSIS — E038 Other specified hypothyroidism: Secondary | ICD-10-CM

## 2022-03-01 MED ORDER — METFORMIN HCL 500 MG PO TABS: 500.0000 mg | ORAL_TABLET | Freq: Two times a day (BID) | ORAL | 0 refills | Status: DC

## 2022-03-02 LAB — COMPREHENSIVE METABOLIC PANEL
ALT: 10 IU/L (ref 0–32)
AST: 20 IU/L (ref 0–40)
Albumin/Globulin Ratio: 1.2 (ref 1.2–2.2)
Albumin: 3.9 g/dL (ref 3.8–4.9)
Alkaline Phosphatase: 103 IU/L (ref 44–121)
BUN/Creatinine Ratio: 21 (ref 9–23)
BUN: 18 mg/dL (ref 6–24)
Bilirubin Total: 0.3 mg/dL (ref 0.0–1.2)
CO2: 27 mmol/L (ref 20–29)
Calcium: 8.9 mg/dL (ref 8.7–10.2)
Chloride: 100 mmol/L (ref 96–106)
Creatinine, Ser: 0.87 mg/dL (ref 0.57–1.00)
Globulin, Total: 3.2 g/dL (ref 1.5–4.5)
Glucose: 94 mg/dL (ref 70–99)
Potassium: 4 mmol/L (ref 3.5–5.2)
Sodium: 141 mmol/L (ref 134–144)
Total Protein: 7.1 g/dL (ref 6.0–8.5)
eGFR: 78 mL/min/{1.73_m2} (ref >59)

## 2022-03-02 LAB — LIPID PANEL
Chol/HDL Ratio: 3.5 ratio (ref 0.0–4.4)
Cholesterol, Total: 212 mg/dL — ABNORMAL HIGH (ref 100–199)
HDL: 61 mg/dL (ref >39)
LDL Chol Calc (NIH): 136 mg/dL — ABNORMAL HIGH (ref 0–99)
Triglycerides: 82 mg/dL (ref 0–149)
VLDL Cholesterol Cal: 15 mg/dL (ref 5–40)

## 2022-03-02 LAB — VITAMIN B12: Vitamin B-12: 366 pg/mL (ref 232–1245)

## 2022-03-02 LAB — TSH: TSH: 0.897 u[IU]/mL (ref 0.450–4.500)

## 2022-03-02 LAB — HEMOGLOBIN A1C
Est. average glucose Bld gHb Est-mCnc: 120 mg/dL
Hgb A1c MFr Bld: 5.8 % — ABNORMAL HIGH (ref 4.8–5.6)

## 2022-03-02 LAB — VITAMIN D 25 HYDROXY (VIT D DEFICIENCY, FRACTURES): Vit D, 25-Hydroxy: 40.6 ng/mL (ref 30.0–100.0)

## 2022-03-02 LAB — T4, FREE: Free T4: 1.64 ng/dL (ref 0.82–1.77)

## 2022-03-02 LAB — MAGNESIUM: Magnesium: 2.3 mg/dL (ref 1.6–2.3)

## 2022-03-02 LAB — INSULIN, RANDOM: INSULIN: 16.1 u[IU]/mL (ref 2.6–24.9)

## 2022-03-13 DIAGNOSIS — E559 Vitamin D deficiency, unspecified: Secondary | ICD-10-CM | POA: Insufficient documentation

## 2022-03-13 DIAGNOSIS — E7849 Other hyperlipidemia: Secondary | ICD-10-CM | POA: Insufficient documentation

## 2022-03-13 NOTE — Progress Notes (Signed)
Chief Complaint:   OBESITY Vanessa Gardner is here to discuss her progress with her obesity treatment plan along with follow-up of her obesity related diagnoses. Vanessa Gardner is on the Category 2 Plan+100 and states she is following her eating plan approximately 25% of the time. Vanessa Gardner states she is workout 60 minutes 3 times per week.  Today's visit was #: 8  Starting weight: 255 lbs Starting date: 09/02/2021 Today's weight: 249 lbs Today's date: 03/01/2022 Total lbs lost to date: 6 lbs Total lbs lost since last in-office visit: 2 lbs  Interim History: Patient not sure how she lost.  She did focus on portion control over Thanksgiving day and still with consistent exercise.  Subjective:   1. Prediabetes Last office visit we increased her metformin dose but patient really did not increase dose consistently.  Maybe 50% of the time she did.  Good control of cravings and hunger.  Her A1c was 5.8, 6 months ago.   2. Vitamin D deficiency Consistent with daily dose.  Last vitamin D level 44.7,  6 months ago.  Patient is taking 1000 IU daily OTC.  3. Other hyperlipidemia Rash check February 2023 with LDL of 147.  Patient has not taken any medications.  4. Essential hypertension Patient with asymptomatic has no concerns.  Blood pressure is well-controlled and no issues with medication.  Patient is taking Hyzaar.  5. Other specified hypothyroidism Last check of TSH on April 27, 2021 at 1.03.  Patient takes Synthroid 125 mcg consistently daily.  Assessment/Plan:   Orders Placed This Encounter  Procedures   Vitamin B12   Magnesium   VITAMIN D 25 Hydroxy (Vit-D Deficiency, Fractures)   TSH   T4, free   Lipid panel   Insulin, random   Hemoglobin A1c   Comprehensive metabolic panel    Medications Discontinued During This Encounter  Medication Reason   metFORMIN (GLUCOPHAGE) 500 MG tablet Reorder   0.9 %  sodium chloride infusion      Meds ordered this encounter  Medications    metFORMIN (GLUCOPHAGE) 500 MG tablet    Sig: Take 1 tablet (500 mg total) by mouth 2 (two) times daily with a meal.    Dispense:  60 tablet    Refill:  0     1. Prediabetes Continue to try to take metformin twice daily.  Continue PNP and weight loss.  Check labs today.  Refill- metFORMIN (GLUCOPHAGE) 500 MG tablet; Take 1 tablet (500 mg total) by mouth 2 (two) times daily with a meal.  Dispense: 60 tablet; Refill: 0  - Vitamin B12 - Magnesium - Insulin, random - Hemoglobin A1c  2. Vitamin D deficiency Continue vitamin D 1000 IU daily OTC.  Check labs today.  - VITAMIN D 25 Hydroxy (Vit-D Deficiency, Fractures)  3. Other hyperlipidemia Decrease saturated and trans fats by following PNP.  Check labs today.  - Lipid panel  4. Essential hypertension Patient blood pressure at goal.  Continue current medications, weight loss, PNP and decrease salt intake.  Check labs today.  - Comprehensive metabolic panel  5. Other specified hypothyroidism Continue medications, increase exercise as tolerated.  Check labs today.  - TSH - T4, free  6. Obesity,current BMI 42.7 Continue to focus on exercise.  Try PC/[place] over Christmas.  Goal is to maintain or lose weight.   Vanessa Gardner is currently in the action stage of change. As such, her goal is to continue with weight loss efforts. She has agreed to the Category 2 Plan+  100 calories.    Exercise goals:  As is.  Behavioral modification strategies: increasing lean protein intake, holiday eating strategies , and celebration eating strategies.  Vanessa Gardner has agreed to follow-up with our clinic in 3-4 weeks. She was informed of the importance of frequent follow-up visits to maximize her success with intensive lifestyle modifications for her multiple health conditions.   Vanessa Gardner was informed we would discuss her lab results at her next visit unless there is a critical issue that needs to be addressed sooner. Vanessa Gardner agreed to keep her next visit at the  agreed upon time to discuss these results.  Objective:   Blood pressure 127/74, pulse (!) 58, temperature 97.6 F (36.4 C), height 5\' 4"  (1.626 m), weight 249 lb (112.9 kg), last menstrual period 07/24/2017, SpO2 99 %. Body mass index is 42.74 kg/m.  General: Cooperative, alert, well developed, in no acute distress. HEENT: Conjunctivae and lids unremarkable. Cardiovascular: Regular rhythm.  Lungs: Normal work of breathing. Neurologic: No focal deficits.   Lab Results  Component Value Date   CREATININE 0.87 03/01/2022   BUN 18 03/01/2022   NA 141 03/01/2022   K 4.0 03/01/2022   CL 100 03/01/2022   CO2 27 03/01/2022   Lab Results  Component Value Date   ALT 10 03/01/2022   AST 20 03/01/2022   ALKPHOS 103 03/01/2022   BILITOT 0.3 03/01/2022   Lab Results  Component Value Date   HGBA1C 5.8 (H) 03/01/2022   HGBA1C 5.8 (H) 09/02/2021   HGBA1C 5.9 04/27/2021   HGBA1C 5.5 10/21/2020   HGBA1C 5.9 04/21/2020   Lab Results  Component Value Date   INSULIN 16.1 03/01/2022   INSULIN 15.7 09/02/2021   Lab Results  Component Value Date   TSH 0.897 03/01/2022   Lab Results  Component Value Date   CHOL 212 (H) 03/01/2022   HDL 61 03/01/2022   LDLCALC 136 (H) 03/01/2022   TRIG 82 03/01/2022   CHOLHDL 3.5 03/01/2022   Lab Results  Component Value Date   VD25OH 40.6 03/01/2022   VD25OH 44.7 09/02/2021   VD25OH 35.70 09/25/2015   Lab Results  Component Value Date   WBC 4.5 04/27/2021   HGB 13.3 04/27/2021   HCT 40.4 04/27/2021   MCV 95.1 04/27/2021   PLT 241.0 04/27/2021   No results found for: "IRON", "TIBC", "FERRITIN"  Attestation Statements:   Reviewed by clinician on day of visit: allergies, medications, problem list, medical history, surgical history, family history, social history, and previous encounter notes.  I, 06/25/2021, RMA, am acting as [place] [employer] for Energy manager, DO.   I have reviewed the above documentation for accuracy and  completeness, and I agree with the above. [employer], D.O.  The 21st Century Cures Act was signed into law in 2016 which includes the topic of electronic health records.  This provides immediate access to information in MyChart.  This includes consultation notes, operative notes, office notes, lab results and pathology reports.  If you have any questions about what you read please let 2017 know at your next visit so we can discuss your concerns and take corrective action if need be.  We are right here with you.

## 2022-03-29 ENCOUNTER — Ambulatory Visit (INDEPENDENT_AMBULATORY_CARE_PROVIDER_SITE_OTHER): Payer: Managed Care, Other (non HMO) | Admitting: Adult Health

## 2022-04-11 ENCOUNTER — Ambulatory Visit (INDEPENDENT_AMBULATORY_CARE_PROVIDER_SITE_OTHER): Payer: Managed Care, Other (non HMO) | Admitting: Adult Health

## 2022-04-11 ENCOUNTER — Encounter (INDEPENDENT_AMBULATORY_CARE_PROVIDER_SITE_OTHER): Payer: Self-pay | Admitting: Adult Health

## 2022-04-11 VITALS — BP 134/77 | HR 59 | Temp 98.2°F | Ht 64.0 in | Wt 251.0 lb

## 2022-04-11 DIAGNOSIS — E559 Vitamin D deficiency, unspecified: Secondary | ICD-10-CM

## 2022-04-11 DIAGNOSIS — E669 Obesity, unspecified: Secondary | ICD-10-CM | POA: Diagnosis not present

## 2022-04-11 DIAGNOSIS — Z6841 Body Mass Index (BMI) 40.0 and over, adult: Secondary | ICD-10-CM

## 2022-04-11 DIAGNOSIS — R7303 Prediabetes: Secondary | ICD-10-CM

## 2022-04-11 MED ORDER — METFORMIN HCL 500 MG PO TABS: 500.0000 mg | ORAL_TABLET | Freq: Two times a day (BID) | ORAL | 0 refills | Status: DC

## 2022-04-17 ENCOUNTER — Other Ambulatory Visit: Payer: Self-pay | Admitting: Primary Care

## 2022-04-17 DIAGNOSIS — F411 Generalized anxiety disorder: Secondary | ICD-10-CM

## 2022-04-24 ENCOUNTER — Other Ambulatory Visit: Payer: Self-pay | Admitting: Primary Care

## 2022-04-24 DIAGNOSIS — E039 Hypothyroidism, unspecified: Secondary | ICD-10-CM

## 2022-04-24 DIAGNOSIS — I10 Essential (primary) hypertension: Secondary | ICD-10-CM

## 2022-04-25 NOTE — Progress Notes (Signed)
Chief Complaint:   OBESITY Vanessa Gardner is here to discuss her progress with her obesity treatment plan along with follow-up of her obesity related diagnoses. Margarita is on the Category 2 Plan +100 calories and states she is following her eating plan approximately 25% of the time. Shaquila states she is boot camp 60 minutes 3 times per week.  Today's visit was #: 9 Starting weight: 255 lbs Starting date: 09/02/2021 Today's weight: 251 lbs Today's date: 04/11/2022 Total lbs lost to date: 4 lbs Total lbs lost since last in-office visit: +2 lbs  Interim History: ***  Subjective:   1. Prediabetes A1c *** Last two insulin levels 16.1, 15.7.  Currently on metformin 500 mg BID with meals.   2. Vitamin D deficiency She is currently on OTC Vitamin D-3 1000 mg daily.  03/01/2022, Vitamin D level 40.6, B12 366.  She endorses stable energy levels.   Assessment/Plan:   1. Prediabetes Refill - metFORMIN (GLUCOPHAGE) 500 MG tablet; Take 1 tablet (500 mg total) by mouth 2 (two) times daily with a meal.  Dispense: 60 tablet; Refill: 0  2. Vitamin D deficiency Continue with daily Vitamin D supplement.    3. Obesity,current BMI 05.3 Vanessa Gardner is currently in the action stage of change. As such, her goal is to continue with weight loss efforts. She has agreed to the Category 2 Plan+100 calories.    Exercise goals:  As is.   Behavioral modification strategies: increasing lean protein intake, decreasing simple carbohydrates, meal planning and cooking strategies, keeping healthy foods in the home, and planning for success.  [name] has agreed to follow-up with our clinic in 4 weeks. She was informed of the importance of frequent follow-up visits to maximize her success with intensive lifestyle modifications for her multiple health conditions.   Objective:   Blood pressure 134/77, pulse (!) 59, temperature 98.2 F (36.8 C), height 5\' 4"  (1.626 m), weight 251 lb (113.9 kg), last menstrual period  07/24/2017, SpO2 99 %. Body mass index is 43.08 kg/m.  General: Cooperative, alert, well developed, in no acute distress. HEENT: Conjunctivae and lids unremarkable. Cardiovascular: Regular rhythm.  Lungs: Normal work of breathing. Neurologic: No focal deficits.   Lab Results  Component Value Date   CREATININE 0.87 03/01/2022   BUN 18 03/01/2022   NA 141 03/01/2022   K 4.0 03/01/2022   CL 100 03/01/2022   CO2 27 03/01/2022   Lab Results  Component Value Date   ALT 10 03/01/2022   AST 20 03/01/2022   ALKPHOS 103 03/01/2022   BILITOT 0.3 03/01/2022   Lab Results  Component Value Date   HGBA1C 5.8 (H) 03/01/2022   HGBA1C 5.8 (H) 09/02/2021   HGBA1C 5.9 04/27/2021   HGBA1C 5.5 10/21/2020   HGBA1C 5.9 04/21/2020   Lab Results  Component Value Date   INSULIN 16.1 03/01/2022   INSULIN 15.7 09/02/2021   Lab Results  Component Value Date   TSH 0.897 03/01/2022   Lab Results  Component Value Date   CHOL 212 (H) 03/01/2022   HDL 61 03/01/2022   LDLCALC 136 (H) 03/01/2022   TRIG 82 03/01/2022   CHOLHDL 3.5 03/01/2022   Lab Results  Component Value Date   VD25OH 40.6 03/01/2022   VD25OH 44.7 09/02/2021   VD25OH 35.70 09/25/2015   Lab Results  Component Value Date   WBC 4.5 04/27/2021   HGB 13.3 04/27/2021   HCT 40.4 04/27/2021   MCV 95.1 04/27/2021   PLT 241.0 04/27/2021  No results found for: "IRON", "TIBC", "FERRITIN"  Attestation Statements:   Reviewed by clinician on day of visit: allergies, medications, problem list, medical history, surgical history, family history, social history, and previous encounter notes.  I, Davy Pique, RMA, am acting as Location manager for Mina Marble, NP.  I have reviewed the above documentation for accuracy and completeness, and I agree with the above. -  ***

## 2022-04-26 ENCOUNTER — Ambulatory Visit (INDEPENDENT_AMBULATORY_CARE_PROVIDER_SITE_OTHER): Payer: Managed Care, Other (non HMO) | Admitting: Family Medicine

## 2022-04-29 ENCOUNTER — Encounter: Payer: Self-pay | Admitting: Primary Care

## 2022-04-29 ENCOUNTER — Ambulatory Visit (INDEPENDENT_AMBULATORY_CARE_PROVIDER_SITE_OTHER): Payer: Managed Care, Other (non HMO) | Admitting: Primary Care

## 2022-04-29 VITALS — BP 116/60 | HR 62 | Temp 97.5°F | Ht 64.0 in | Wt 252.0 lb

## 2022-04-29 DIAGNOSIS — Z23 Encounter for immunization: Secondary | ICD-10-CM | POA: Diagnosis not present

## 2022-04-29 DIAGNOSIS — Z1231 Encounter for screening mammogram for malignant neoplasm of breast: Secondary | ICD-10-CM | POA: Diagnosis not present

## 2022-04-29 DIAGNOSIS — E038 Other specified hypothyroidism: Secondary | ICD-10-CM | POA: Diagnosis not present

## 2022-04-29 DIAGNOSIS — R7303 Prediabetes: Secondary | ICD-10-CM

## 2022-04-29 DIAGNOSIS — I10 Essential (primary) hypertension: Secondary | ICD-10-CM | POA: Diagnosis not present

## 2022-04-29 DIAGNOSIS — F411 Generalized anxiety disorder: Secondary | ICD-10-CM

## 2022-04-29 DIAGNOSIS — Z0000 Encounter for general adult medical examination without abnormal findings: Secondary | ICD-10-CM

## 2022-04-29 DIAGNOSIS — E7849 Other hyperlipidemia: Secondary | ICD-10-CM

## 2022-04-29 MED ORDER — LEVOTHYROXINE SODIUM 125 MCG PO TABS: ORAL_TABLET | ORAL | 3 refills | Status: DC

## 2022-04-29 NOTE — Assessment & Plan Note (Signed)
Controlled.  Continue sertraline 50 mg daily. She will notify when ready to wean off.

## 2022-04-29 NOTE — Assessment & Plan Note (Signed)
Controlled.  Continue losartan-HCTZ 50-12.5 mg daily. CMP reviewed from December 2023.

## 2022-04-29 NOTE — Progress Notes (Signed)
Subjective:    Patient ID: Vanessa Gardner, female    DOB: 1965-03-26, 57 y.o.   MRN: [id number]  HPI  Vanessa Gardner is a very pleasant 57 y.o. female who presents today for complete physical and follow up of chronic conditions.  Immunizations: -Tetanus: Completed in 2017 -Influenza: Completed today -Shingles: Completed Shingrix series  Diet: Fair diet. Recently joined [employer].  Exercise: Regular exercise three days weekly.  Eye exam: Completes annually  Dental exam: Completes semi-annually   Pap Smear: Completed in 2023 Mammogram: Completed in 2022, did not complete last year.   Colonoscopy: Completed in 2019, due 2029  BP Readings from Last 3 Encounters:  04/29/22 116/60  04/11/22 134/77  03/01/22 127/74   Wt Readings from Last 3 Encounters:  04/29/22 252 lb (114.3 kg)  04/11/22 251 lb (113.9 kg)  03/01/22 249 lb (112.9 kg)      Review of Systems  Constitutional:  Negative for unexpected weight change.  HENT:  Negative for rhinorrhea.   Respiratory:  Negative for cough and shortness of breath.   Cardiovascular:  Negative for chest pain.  Gastrointestinal:  Negative for constipation and diarrhea.  Genitourinary:  Negative for difficulty urinating.  Musculoskeletal:  Negative for arthralgias and myalgias.  Skin:  Negative for rash.  Allergic/Immunologic: Negative for environmental allergies.  Neurological:  Negative for dizziness and headaches.  Psychiatric/Behavioral:  The patient is not nervous/anxious.          Past Medical History:  Diagnosis Date   Allergy    Anxiety    Chicken pox    Essential hypertension    GERD (gastroesophageal reflux disease)    Graves disease    Hypothyroidism    Prediabetes     Social History   Socioeconomic History   Marital status: Married    Spouse name: Elberta Fortis Cokley   Number of children: Not on file   Years of education: Not on file   Highest education level: Not on file  Occupational History    Occupation: Education officer, museum  Tobacco Use   Smoking status: Never   Smokeless tobacco: Never  Vaping Use   Vaping Use: Never used  Substance and Sexual Activity   Alcohol use: Yes    Comment: twice a month-wine   Drug use: No   Sexual activity: Not on file  Other Topics Concern   Not on file  Social History Narrative   ** Merged History Encounter **       Married. 2 children. Works as a Education officer, museum. Enjoys exercising, going to the movies, shopping.    Social Determinants of Health   Financial Resource Strain: Not on file  Food Insecurity: Not on file  Transportation Needs: Not on file  Physical Activity: Not on file  Stress: Not on file  Social Connections: Not on file  Intimate Partner Violence: Not on file    Past Surgical History:  Procedure Laterality Date   EYE SURGERY Right 2013   TUBAL LIGATION      Family History  Problem Relation Age of Onset   Hypertension Mother    Kidney disease Mother    Alcohol abuse Father    Hypertension Father    Cirrhosis Father    [place] Cholesterol Father    Heart disease Father    Obesity Father    Throat cancer Maternal Grandfather    Breast cancer Neg Hx    Colon cancer Neg Hx    Stomach cancer Neg Hx  Esophageal cancer Neg Hx     Allergies  Allergen Reactions   Sulfa Antibiotics Anaphylaxis   Cefuroxime Axetil     REACTION: swelling   Norvasc [Amlodipine Besylate] Swelling    Current Outpatient Medications on File Prior to Visit  Medication Sig Dispense Refill   cholecalciferol (VITAMIN D) 1000 units tablet Take 1,000 Units by mouth daily.     loratadine (CLARITIN) 10 MG tablet Take 10 mg by mouth daily.     losartan-hydrochlorothiazide (HYZAAR) 50-12.5 MG tablet TAKE 1 TABLET BY MOUTH DAILY FOR BLOOD PRESSURE 90 tablet 0   sertraline (ZOLOFT) 50 MG tablet TAKE 1 TABLET (50 MG TOTAL) BY MOUTH DAILY. FOR ANXIETY 90 tablet 0   fluticasone (FLONASE) 50 MCG/ACT nasal spray Place 2 sprays into both nostrils  daily. (Patient not taking: Reported on 04/29/2022) 16 g 0   No current facility-administered medications on file prior to visit.    BP 116/60   Pulse 62   Temp (!) 97.5 F (36.4 C) (Temporal)   Ht 5' 4"$  (1.626 m)   Wt 252 lb (114.3 kg)   LMP 07/24/2017   SpO2 100%   BMI 43.26 kg/m  Objective:   Physical Exam HENT:     Right Ear: Tympanic membrane and ear canal normal.     Left Ear: Tympanic membrane and ear canal normal.     Nose: Nose normal.  Eyes:     Conjunctiva/sclera: Conjunctivae normal.     Pupils: Pupils are equal, round, and reactive to light.  Neck:     Thyroid: No thyromegaly.  Cardiovascular:     Rate and Rhythm: Normal rate and regular rhythm.     Heart sounds: No murmur heard. Pulmonary:     Effort: Pulmonary effort is normal.     Breath sounds: Normal breath sounds. No rales.  Abdominal:     General: Bowel sounds are normal.     Palpations: Abdomen is soft.     Tenderness: There is no abdominal tenderness.  Musculoskeletal:        General: Normal range of motion.     Cervical back: Neck supple.  Lymphadenopathy:     Cervical: No cervical adenopathy.  Skin:    General: Skin is warm and dry.     Findings: No rash.  Neurological:     Mental Status: She is alert and oriented to person, place, and time.     Cranial Nerves: No cranial nerve deficit.     Deep Tendon Reflexes: Reflexes are normal and symmetric.           Assessment & Plan:  Preventative health care Assessment & Plan: Immunizations UTD. Influenza vaccine provided today. Colonoscopy UTD, due 2029 Pap smear UTD. Mammogram due, orders placed.  Discussed the importance of a healthy diet and regular exercise in order for weight loss, and to reduce the risk of further co-morbidity.  Exam stable. Labs reviewed.  Follow up in 1 year for repeat physical.    Essential hypertension Assessment & Plan: Controlled.  Continue losartan-HCTZ 50-12.5 mg daily. CMP reviewed from  December 2023.   Other specified hypothyroidism Assessment & Plan: She is taking vitamins within 4 hours of Synthroid, discussed to separate.  Otherwise taking correctly.  Will send in Rx for levothyroxine as insurance is no longer covering Synthroid.  Start levothyroxine 125 mcg. She will update if she does not feel well on levothyroxine.   Orders: -     Levothyroxine Sodium; Take 1 tablet by mouth every morning  on an empty stomach with water only.  No food or other medications for 30 minutes.  Dispense: 90 tablet; Refill: 3  Screening mammogram for breast cancer -     3D Screening Mammogram, Left and Right; Future  GAD (generalized anxiety disorder) Assessment & Plan: Controlled.  Continue sertraline 50 mg daily. She will notify when ready to wean off.   Other hyperlipidemia Assessment & Plan: Reviewed lipid panel from December 2023. Continue with healthy weight and wellness center.  Continue with regular exercise.    Prediabetes Assessment & Plan: Reviewed A1C from December 2023. Continue to work on exercise and diet.    Need for immunization against influenza -     Flu Vaccine QUAD 50moIM (Fluarix, Fluzone & Alfiuria Quad PF)        KPleas Koch NP

## 2022-04-29 NOTE — Assessment & Plan Note (Signed)
Reviewed lipid panel from December 2023. Continue with healthy weight and wellness center.  Continue with regular exercise.

## 2022-04-29 NOTE — Assessment & Plan Note (Signed)
Immunizations UTD. Influenza vaccine provided today. Colonoscopy UTD, due 2029 Pap smear UTD. Mammogram due, orders placed.  Discussed the importance of a healthy diet and regular exercise in order for weight loss, and to reduce the risk of further co-morbidity.  Exam stable. Labs reviewed.  Follow up in 1 year for repeat physical.

## 2022-04-29 NOTE — Assessment & Plan Note (Signed)
Reviewed A1C from December 2023. Continue to work on exercise and diet.

## 2022-04-29 NOTE — Patient Instructions (Signed)
[facility] to schedule your [facility].   It was a pleasure to see you today!

## 2022-04-29 NOTE — Assessment & Plan Note (Signed)
She is taking vitamins within 4 hours of Synthroid, discussed to separate.  Otherwise taking correctly.  Will send in Rx for levothyroxine as insurance is no longer covering Synthroid.  Start levothyroxine 125 mcg. She will update if she does not feel well on levothyroxine.

## 2022-05-10 ENCOUNTER — Ambulatory Visit (INDEPENDENT_AMBULATORY_CARE_PROVIDER_SITE_OTHER): Payer: Managed Care, Other (non HMO) | Admitting: Adult Health

## 2022-06-15 ENCOUNTER — Ambulatory Visit
Admission: RE | Admit: 2022-06-15 | Discharge: 2022-06-15 | Disposition: A | Discharge status: Home / Self Care | Payer: Managed Care, Other (non HMO) | Source: Ambulatory Visit | Attending: Primary Care | Admitting: Primary Care

## 2022-06-15 DIAGNOSIS — Z1231 Encounter for screening mammogram for malignant neoplasm of breast: Secondary | ICD-10-CM

## 2022-07-20 ENCOUNTER — Other Ambulatory Visit: Payer: Self-pay | Admitting: Primary Care

## 2022-07-20 DIAGNOSIS — F411 Generalized anxiety disorder: Secondary | ICD-10-CM

## 2022-07-24 ENCOUNTER — Other Ambulatory Visit: Payer: Self-pay | Admitting: Primary Care

## 2022-07-24 DIAGNOSIS — I10 Essential (primary) hypertension: Secondary | ICD-10-CM

## 2022-11-25 ENCOUNTER — Ambulatory Visit: Payer: Federal, State, Local not specified - PPO | Admitting: Primary Care

## 2022-11-25 ENCOUNTER — Ambulatory Visit (INDEPENDENT_AMBULATORY_CARE_PROVIDER_SITE_OTHER)
Admission: RE | Admit: 2022-11-25 | Discharge: 2022-11-25 | Disposition: A | Discharge status: Home / Self Care | Payer: Federal, State, Local not specified - PPO | Source: Ambulatory Visit | Attending: Primary Care | Admitting: Primary Care

## 2022-11-25 ENCOUNTER — Encounter: Payer: Self-pay | Admitting: Primary Care

## 2022-11-25 VITALS — BP 118/66 | HR 65 | Temp 97.2°F | Ht 64.0 in | Wt 255.0 lb

## 2022-11-25 DIAGNOSIS — M19072 Primary osteoarthritis, left ankle and foot: Secondary | ICD-10-CM | POA: Diagnosis not present

## 2022-11-25 DIAGNOSIS — M79672 Pain in left foot: Secondary | ICD-10-CM

## 2022-11-25 DIAGNOSIS — R5383 Other fatigue: Secondary | ICD-10-CM | POA: Diagnosis not present

## 2022-11-25 DIAGNOSIS — E038 Other specified hypothyroidism: Secondary | ICD-10-CM

## 2022-11-25 DIAGNOSIS — Z23 Encounter for immunization: Secondary | ICD-10-CM

## 2022-11-25 DIAGNOSIS — G8929 Other chronic pain: Secondary | ICD-10-CM

## 2022-11-25 DIAGNOSIS — M2012 Hallux valgus (acquired), left foot: Secondary | ICD-10-CM | POA: Diagnosis not present

## 2022-11-25 LAB — CBC
HCT: 40 % (ref 36.0–46.0)
Hemoglobin: 13.1 g/dL (ref 12.0–15.0)
MCHC: 32.6 g/dL (ref 30.0–36.0)
MCV: 94.7 fl (ref 78.0–100.0)
Platelets: 250 10*3/uL (ref 150.0–400.0)
RBC: 4.22 Mil/uL (ref 3.87–5.11)
RDW: 13.6 % (ref 11.5–15.5)
WBC: 3.4 10*3/uL — ABNORMAL LOW (ref 4.0–10.5)

## 2022-11-25 LAB — TSH: TSH: 0.31 u[IU]/mL — ABNORMAL LOW (ref 0.35–5.50)

## 2022-11-25 LAB — HEMOGLOBIN A1C: Hgb A1c MFr Bld: 6 % (ref 4.6–6.5)

## 2022-11-25 NOTE — Assessment & Plan Note (Signed)
Likely secondary to increased personal stress and heel pain.  Will check labs today to rule out metabolic cause including thyroid studies, A1c, CBC. Await results.

## 2022-11-25 NOTE — Assessment & Plan Note (Signed)
Symptoms and presentation representative of heel spur.  Checking plain films of the left heel today. Recommended heel cups to bilateral shoes.  Referral placed to podiatry.

## 2022-11-25 NOTE — Assessment & Plan Note (Signed)
She is taking levothyroxine correctly.  Continue levothyroxine 125 mcg daily. Repeat thyroid studies pending.

## 2022-11-25 NOTE — Patient Instructions (Signed)
Stop by the lab and xray prior to leaving today. I will notify you of your results once received.   Purchase some heel cups from the store to help with heel support.  You will either be contacted via phone regarding your referral to podiatry, or you may receive a letter on your MyChart portal from our referral team with instructions for scheduling an appointment. Please let us know if you have not been contacted by anyone within two weeks.  It was a pleasure to see you today!

## 2022-11-25 NOTE — Progress Notes (Signed)
Subjective:    Patient ID: Vanessa Gardner, female    DOB: [birth year], 57 y.o.   MRN: [id number]  Foot Pain Associated symptoms include arthralgias and fatigue. Pertinent negatives include no chest pain.    Vanessa Gardner is a very pleasant 57 y.o. female with a history of hypertension, hypothyroidism, prediabetes, hyperlipidemia who presents today to discuss several concerns.  1) Foot Pain: Her pain is located to the bilateral heels, left heel worse than right. Her pain began around April 2024 and has progressively worsened. She notices her pain is worse with prolonged walking/standing, standing after sitting for prolonged periods of time. She's not tired anything OTC for symptoms.   She denies injury/trauma, numbness/tingling, color changes.    2) Hypothyroidism/Fatigue: Currently on levothyroxine 125 mcg tablets for which she began in May 2024.  Previously managed on Synthroid but insurance would no longer cover.  Since the transition to levothyroxine she has noted increased fatigue and weight gain.  She questions if levothyroxine is effective.  Last TSH on file was 0.897 from December 2023.  She retired in April 2024. She has been under personal stress as her daughter had her baby prematurely so she has been running back and forth to [facility]. She's also frustrated with her heel pain that has kept her from exercising as much as she'd like.  She is taking levothyroxine every morning on an empty stomach with water only.   No food or other medications for 30 minutes.   No heartburn medication, iron pills, calcium, vitamin D, or magnesium pills within four hours of taking levothyroxine.     Review of Systems  Constitutional:  Positive for fatigue.  Respiratory:  Negative for shortness of breath.   Cardiovascular:  Negative for chest pain and palpitations.  Musculoskeletal:  Positive for arthralgias.  Skin:  Negative for color change.         Past Medical History:   Diagnosis Date   Allergy    Anxiety    Chicken pox    Essential hypertension    GERD (gastroesophageal reflux disease)    Graves disease    Hypothyroidism    Prediabetes     Social History   Socioeconomic History   Marital status: Married    Spouse name: Vanessa Gardner   Number of children: Not on file   Years of education: Not on file   Highest education level: Not on file  Occupational History   Occupation: Child psychotherapist  Tobacco Use   Smoking status: Never   Smokeless tobacco: Never  Vaping Use   Vaping status: Never Used  Substance and Sexual Activity   Alcohol use: Yes    Comment: twice a month-wine   Drug use: No   Sexual activity: Not on file  Other Topics Concern   Not on file  Social History Narrative   ** Merged History Encounter **       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 Enjoys exercising, going to the movies, shopping.    Social Determinants of Health   Financial Resource Strain: Not on file  Food Insecurity: Not on file  Transportation Needs: Not on file  Physical Activity: Not on file  Stress: Not on file  Social Connections: Not on file  Intimate Partner Violence: Not on file    Past Surgical History:  Procedure Laterality Date   EYE SURGERY Right 2013   TUBAL LIGATION      Family History  Problem Relation Age of  Onset   Hypertension Mother    Kidney disease Mother    Alcohol abuse Father    Hypertension Father    Cirrhosis Father    [place] Cholesterol Father    Heart disease Father    Obesity Father    Throat cancer Maternal Grandfather    Breast cancer Neg Hx    Colon cancer Neg Hx    Stomach cancer Neg Hx    Esophageal cancer Neg Hx     Allergies  Allergen Reactions   Sulfa Antibiotics Anaphylaxis   Cefuroxime Axetil     REACTION: swelling   Norvasc [Amlodipine Besylate] Swelling    Current Outpatient Medications on File Prior to Visit  Medication Sig Dispense Refill   cholecalciferol (VITAMIN D) 1000  units tablet Take 1,000 Units by mouth daily.     levothyroxine (SYNTHROID) 125 MCG tablet Take 1 tablet by mouth every morning on an empty stomach with water only.  No food or other medications for 30 minutes. 90 tablet 3   loratadine (CLARITIN) 10 MG tablet Take 10 mg by mouth daily.     losartan-hydrochlorothiazide (HYZAAR) 50-12.5 MG tablet TAKE 1 TABLET BY MOUTH EVERY DAY FOR BLOOD PRESSURE 90 tablet 2   fluticasone (FLONASE) 50 MCG/ACT nasal spray Place 2 sprays into both nostrils daily. (Patient not taking: Reported on 04/29/2022) 16 g 0   sertraline (ZOLOFT) 50 MG tablet TAKE 1 TABLET (50 MG TOTAL) BY MOUTH DAILY. FOR ANXIETY (Patient not taking: Reported on 11/25/2022) 90 tablet 2   No current facility-administered medications on file prior to visit.    BP 118/66   Pulse 65   Temp (!) 97.2 F (36.2 C) (Temporal)   Ht 5\' 4"  (1.626 m)   Wt 255 lb (115.7 kg)   LMP 07/24/2017   SpO2 99%   BMI 43.77 kg/m  Objective:   Physical Exam Cardiovascular:     Rate and Rhythm: Normal rate and regular rhythm.     Pulses:          Dorsalis pedis pulses are 2+ on the right side and 2+ on the left side.  Pulmonary:     Effort: Pulmonary effort is normal.     Breath sounds: Normal breath sounds.  Musculoskeletal:     Cervical back: Neck supple.     Right foot: Normal range of motion.     Left foot: Normal range of motion.  Feet:     Right foot:     Skin integrity: Skin integrity normal. No ulcer, skin breakdown, erythema, callus or fissure.     Left foot:     Skin integrity: Skin integrity normal. No ulcer, skin breakdown, erythema, callus or fissure.     Comments: Non tender to bilateral heels upon palpation  Skin:    General: Skin is warm and dry.           Assessment & Plan:  Other specified hypothyroidism Assessment & Plan: She is taking levothyroxine correctly.  Continue levothyroxine 125 mcg daily. Repeat thyroid studies pending.  Orders: -     TSH  Other  fatigue Assessment & Plan: Likely secondary to increased personal stress and heel pain.  Will check labs today to rule out metabolic cause including thyroid studies, A1c, CBC. Await results.  Orders: -     Hemoglobin A1c -     CBC  Encounter for immunization -     Flu vaccine trivalent PF, 6mos and older(Flulaval,Afluria,Fluarix,Fluzone)  Chronic foot pain, left Assessment & Plan:  Symptoms and presentation representative of heel spur.  Checking plain films of the left heel today. Recommended heel cups to bilateral shoes.  Referral placed to podiatry.  Orders: -     DG Foot Complete Left -     Ambulatory referral to Podiatry        Doreene Nest, NP

## 2022-11-28 ENCOUNTER — Other Ambulatory Visit: Payer: Self-pay | Admitting: Primary Care

## 2022-11-28 DIAGNOSIS — E038 Other specified hypothyroidism: Secondary | ICD-10-CM

## 2022-11-28 MED ORDER — LEVOTHYROXINE SODIUM 112 MCG PO TABS: ORAL_TABLET | ORAL | 0 refills | Status: DC

## 2022-12-06 ENCOUNTER — Ambulatory Visit: Payer: Federal, State, Local not specified - PPO | Admitting: Podiatry

## 2022-12-06 ENCOUNTER — Encounter: Payer: Self-pay | Admitting: Podiatry

## 2022-12-06 ENCOUNTER — Ambulatory Visit (INDEPENDENT_AMBULATORY_CARE_PROVIDER_SITE_OTHER): Payer: Federal, State, Local not specified - PPO

## 2022-12-06 VITALS — BP 133/65 | HR 59

## 2022-12-06 DIAGNOSIS — M722 Plantar fascial fibromatosis: Secondary | ICD-10-CM | POA: Diagnosis not present

## 2022-12-06 MED ORDER — BETAMETHASONE SOD PHOS & ACET 6 (3-3) MG/ML IJ SUSP
3.0000 mg | Freq: Once | INTRAMUSCULAR | Status: AC
  Administered 2022-12-06: 3 mg via INTRA_ARTICULAR

## 2022-12-06 MED ORDER — METHYLPREDNISOLONE 4 MG PO TBPK: ORAL_TABLET | ORAL | 0 refills | Status: DC

## 2022-12-06 MED ORDER — MELOXICAM 15 MG PO TABS: 15.0000 mg | ORAL_TABLET | Freq: Every day | ORAL | 1 refills | Status: DC

## 2022-12-06 NOTE — Progress Notes (Signed)
   Chief Complaint  Patient presents with   Foot Pain    "I have heel pain in both of my feet.  It's in both of my feet but my doctor only did xrays of my left foot." N - heel pain L - plantar bilateral, lt > rt D - 2 mos O - suddenly, got worse C - sharp pain, throbbing A - get up in the morning, excessive walking T - PCP, xrays left foot, inserts    Subjective: 57 y.o. female presenting today for evaluation of bilateral heel pain.  Onset about 2 months ago.  Idiopathic.  Denies a history of injury, change in activity, or shoe gear.  Patient loves to exercise.  She is actually retired April 2024.  Currently she has not done anything for treatment   Past Medical History:  Diagnosis Date   Allergy    Anxiety    Chicken pox    Essential hypertension    GERD (gastroesophageal reflux disease)    Graves disease    Hypothyroidism    Prediabetes      Objective: Physical Exam General: The patient is alert and oriented x3 in no acute distress.  Dermatology: Skin is warm, dry and supple bilateral lower extremities. Negative for open lesions or macerations bilateral.   Vascular: Dorsalis Pedis and Posterior Tibial pulses palpable bilateral.  Capillary fill time is immediate to all digits.  Neurological: Epicritic and protective threshold intact bilateral.   Musculoskeletal: Tenderness to palpation to the plantar aspect of the bilateral heels along the plantar fascia. All other joints range of motion within normal limits bilateral. Strength 5/5 in all groups bilateral.   Radiographic exam B/L feet 12/06/2022: Normal osseous mineralization. Joint spaces preserved. No fracture/dislocation/boney destruction. No other soft tissue abnormalities or radiopaque foreign bodies.  Small plantar heel spur noted on lateral view bilateral  Assessment: 1. plantar fasciitis bilateral feet  Plan of Care:  -Patient evaluated. Xrays reviewed.   -Injection of 0.5cc Celestone soluspan injected into  the bilateral heels.  -Rx for Medrol Dose Pak placed -Rx for Meloxicam 15 mg daily -Recommend good supportive tennis shoes and sneakers.  Patient does admit to walking around the house barefoot.  Advised against this.  Recommend OOFOS sandals or slides especially around the house -Instructed patient regarding therapies and modalities at home to alleviate symptoms.  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  Felecia Shelling, DPM [facility]  Dr. Felecia Shelling, DPM    [address] [place], [place] [address]                Office [phone]  Fax [phone]

## 2023-01-06 ENCOUNTER — Ambulatory Visit: Payer: Federal, State, Local not specified - PPO | Admitting: Podiatry

## 2023-01-06 DIAGNOSIS — M722 Plantar fascial fibromatosis: Secondary | ICD-10-CM | POA: Diagnosis not present

## 2023-01-06 NOTE — Progress Notes (Signed)
   No chief complaint on file.   Subjective: 57 y.o. female presenting today for follow-up evaluation of bilateral heel pain.  Patient states that she is doing well.  She has no pain and tenderness.  Overall she is very satisfied and has no pain with walking   Past Medical History:  Diagnosis Date   Allergy    Anxiety    Chicken pox    Essential hypertension    GERD (gastroesophageal reflux disease)    Graves disease    Hypothyroidism    Prediabetes      Objective: Physical Exam General: The patient is alert and oriented x3 in no acute distress.  Dermatology: Skin is warm, dry and supple bilateral lower extremities. Negative for open lesions or macerations bilateral.   Vascular: Dorsalis Pedis and Posterior Tibial pulses palpable bilateral.  Capillary fill time is immediate to all digits.  Neurological: Grossly intact via light touch  Musculoskeletal: For any significant tenderness to palpation to the plantar aspect of the bilateral heels along the plantar fascia. All other joints range of motion within normal limits bilateral. Strength 5/5 in all groups bilateral.   Radiographic exam B/L feet 12/06/2022: Normal osseous mineralization. Joint spaces preserved. No fracture/dislocation/boney destruction. No other soft tissue abnormalities or radiopaque foreign bodies.  Small plantar heel spur noted on lateral view bilateral  Assessment: 1. plantar fasciitis bilateral feet  Plan of Care:  -Patient evaluated. Xrays reviewed.   - Continue meloxicam 15 mg daily as needed -Continue to refrain from going barefoot.  Patient purchased a pair of OOFOS sandals that help alleviate a lot of her symptoms.  Continue. 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  Felecia Shelling, DPM [facility]  Dr. Felecia Shelling, DPM    [address] [place], [place] [address]                Office [phone]  Fax [phone]

## 2023-01-30 ENCOUNTER — Other Ambulatory Visit (INDEPENDENT_AMBULATORY_CARE_PROVIDER_SITE_OTHER): Payer: Federal, State, Local not specified - PPO

## 2023-01-30 DIAGNOSIS — E038 Other specified hypothyroidism: Secondary | ICD-10-CM | POA: Diagnosis not present

## 2023-01-30 LAB — TSH: TSH: 1.53 u[IU]/mL (ref 0.35–5.50)

## 2023-02-08 IMAGING — MG MM DIGITAL SCREENING BILAT W/ TOMO AND CAD
8 series · 8 of 24 positions shown · non-contrast
Comparison: Previous exam(s).

ACR Breast Density Category a: The breast tissue is almost entirely
fatty.

CLINICAL DATA: Screening.

EXAM:
DIGITAL SCREENING BILATERAL MAMMOGRAM WITH TOMOSYNTHESIS AND CAD
TECHNIQUE: Bilateral screening digital craniocaudal and mediolateral oblique
mammograms were obtained. Bilateral screening digital breast
tomosynthesis was performed. The images were evaluated with
computer-aided detection.

[L CC synth-2D]
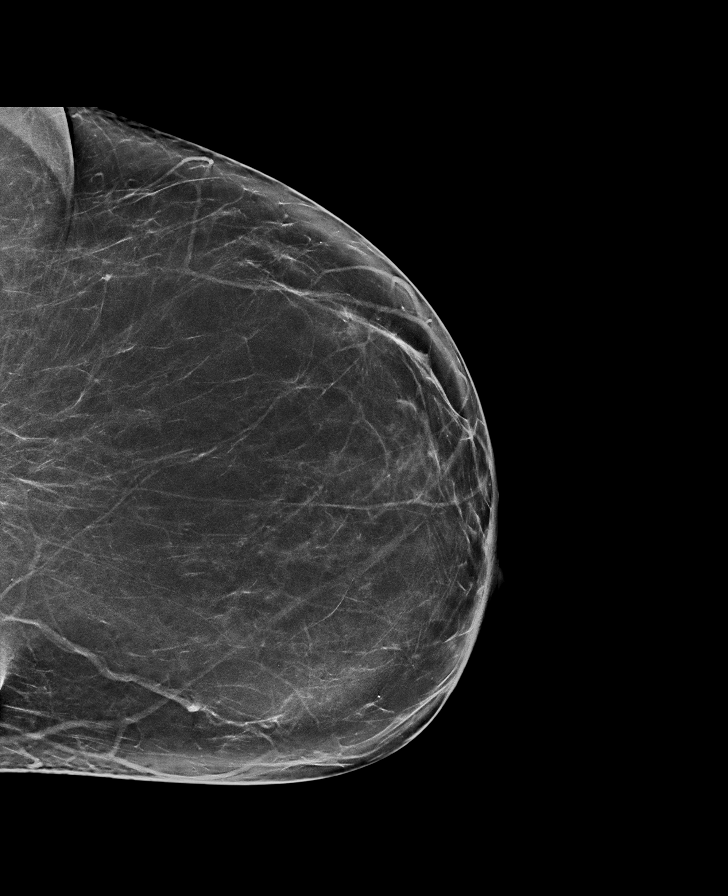

[R CC synth-2D]
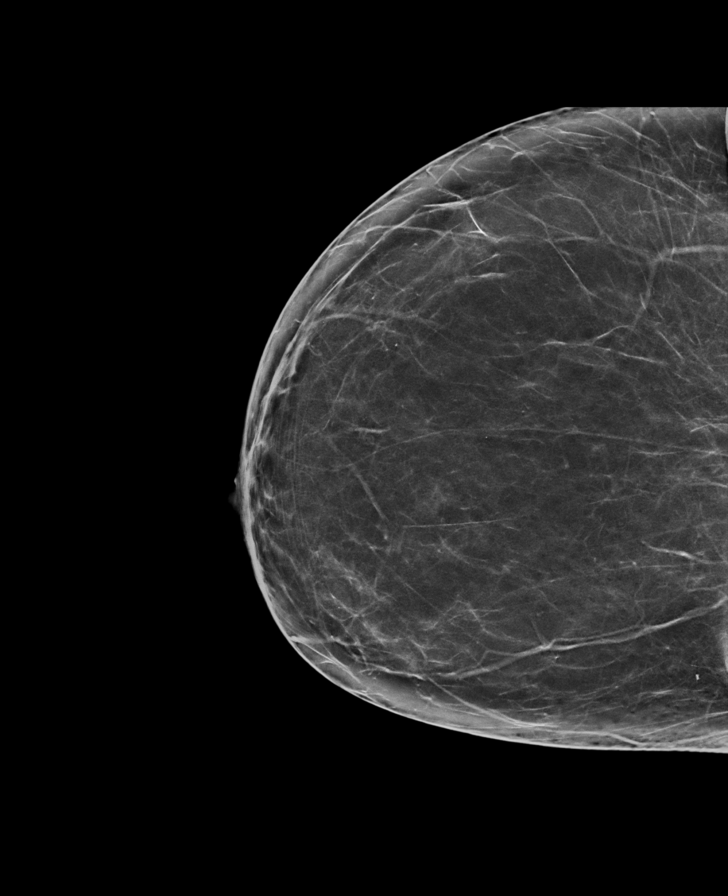

[L MLO synth-2D]
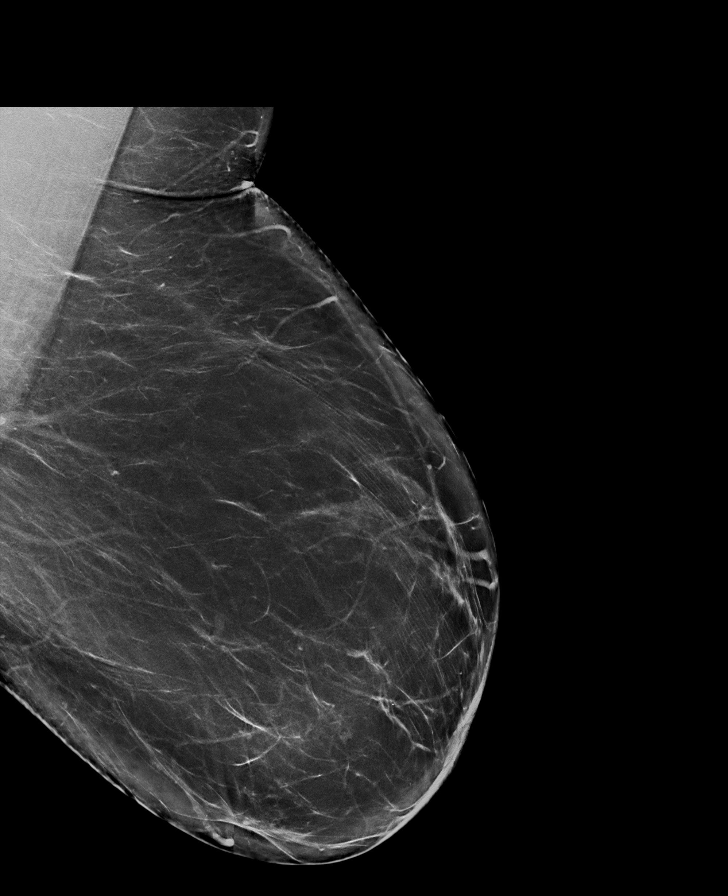

[R MLO synth-2D]
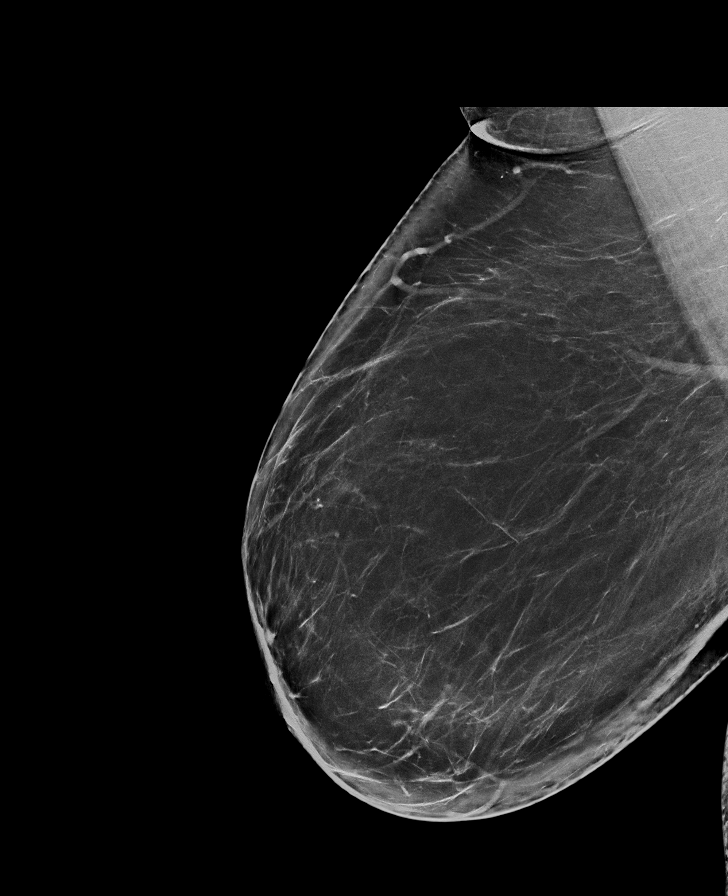

[L MLO tomo · tomo slice 47/94.0]
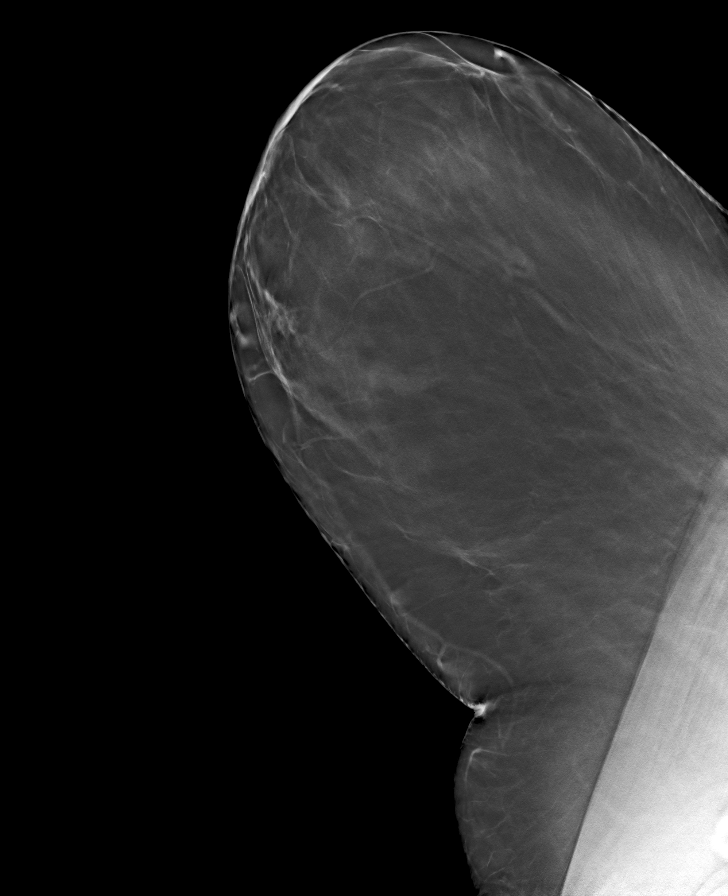

[R CC tomo · tomo slice 37/74.0]
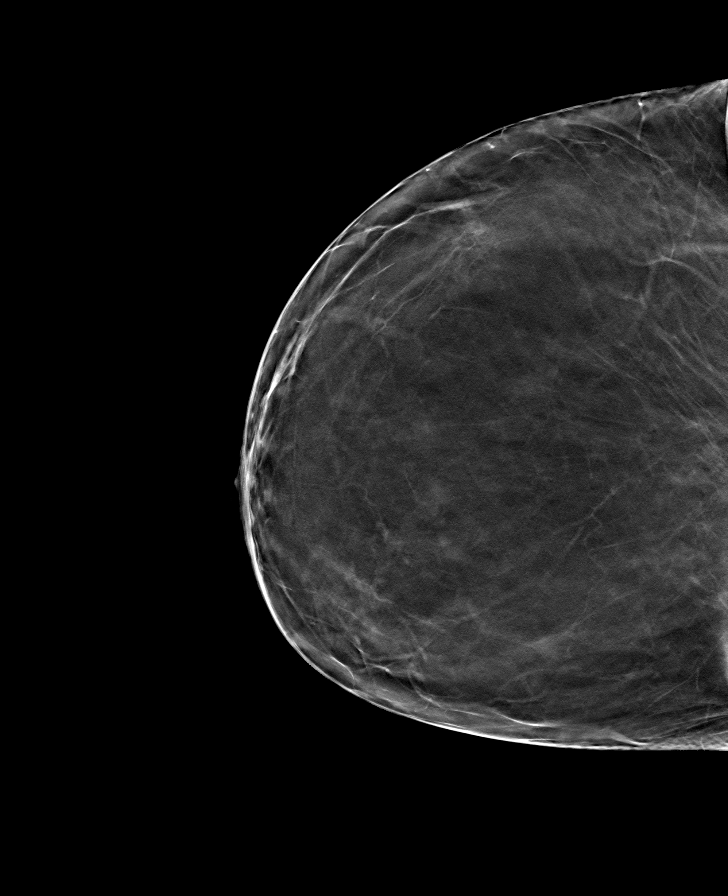

[L CC tomo · tomo slice 41/80.0]
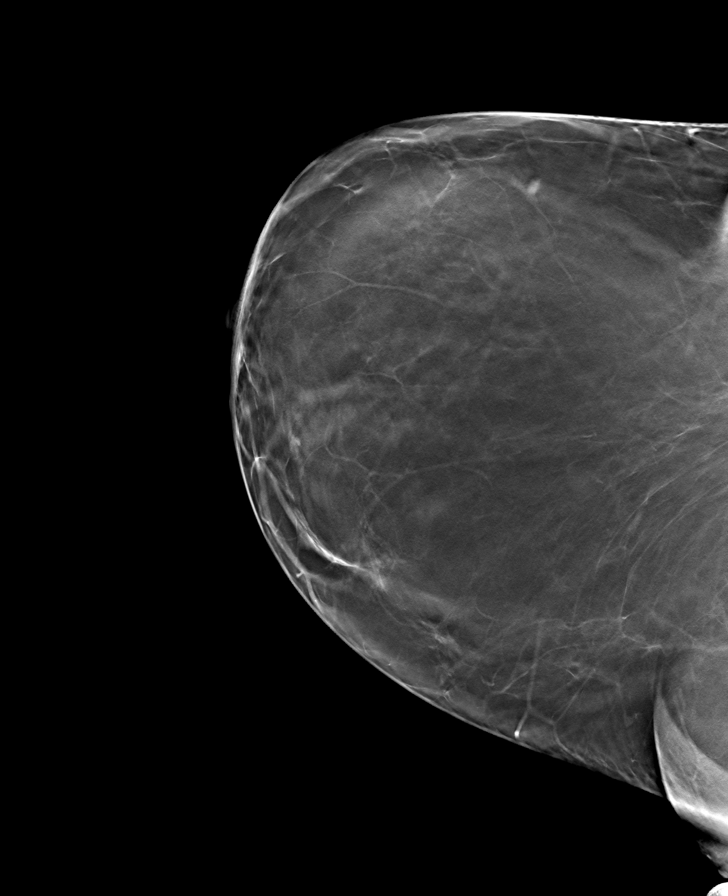

[R MLO tomo · tomo slice 45/88.0]
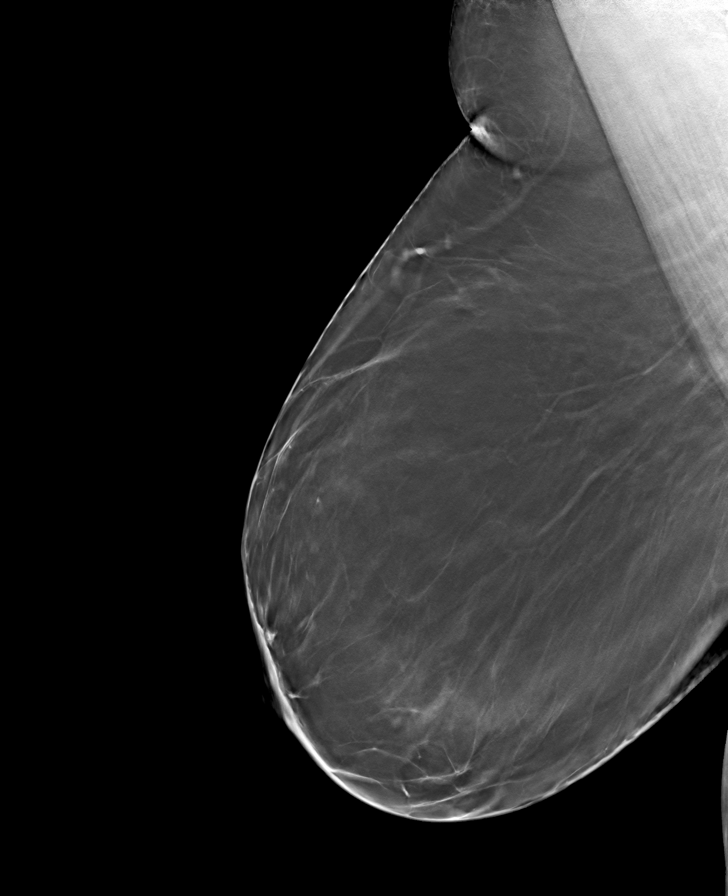

[8 of 24 positions shown; findings below may reference images not displayed]

FINDINGS: There are no findings suspicious for malignancy. The images were
evaluated with computer-aided detection.
IMPRESSION: No mammographic evidence of malignancy. A result letter of this
screening mammogram will be mailed directly to the patient.

RECOMMENDATION:
Screening mammogram in one year. (Code:JP-J-DD5)

BI-RADS CATEGORY  1: Negative.

## 2023-02-22 ENCOUNTER — Other Ambulatory Visit: Payer: Self-pay | Admitting: Primary Care

## 2023-02-22 DIAGNOSIS — E038 Other specified hypothyroidism: Secondary | ICD-10-CM

## 2023-02-22 MED ORDER — LEVOTHYROXINE SODIUM 112 MCG PO TABS: ORAL_TABLET | ORAL | 2 refills | Status: DC

## 2023-03-30 ENCOUNTER — Other Ambulatory Visit: Payer: Self-pay | Admitting: Podiatry

## 2023-04-25 ENCOUNTER — Other Ambulatory Visit: Payer: Self-pay | Admitting: Primary Care

## 2023-04-25 DIAGNOSIS — I10 Essential (primary) hypertension: Secondary | ICD-10-CM

## 2023-05-02 ENCOUNTER — Encounter: Payer: Federal, State, Local not specified - PPO | Admitting: Primary Care

## 2023-05-12 ENCOUNTER — Ambulatory Visit: Payer: Federal, State, Local not specified - PPO | Admitting: Primary Care

## 2023-05-12 ENCOUNTER — Telehealth: Payer: Self-pay

## 2023-05-12 ENCOUNTER — Encounter: Payer: Self-pay | Admitting: Primary Care

## 2023-05-12 VITALS — BP 102/64 | HR 75 | Temp 97.3°F | Ht 64.0 in | Wt 266.0 lb

## 2023-05-12 DIAGNOSIS — Z6841 Body Mass Index (BMI) 40.0 and over, adult: Secondary | ICD-10-CM | POA: Diagnosis not present

## 2023-05-12 DIAGNOSIS — R7303 Prediabetes: Secondary | ICD-10-CM

## 2023-05-12 DIAGNOSIS — I10 Essential (primary) hypertension: Secondary | ICD-10-CM | POA: Diagnosis not present

## 2023-05-12 DIAGNOSIS — E66813 Obesity, class 3: Secondary | ICD-10-CM | POA: Diagnosis not present

## 2023-05-12 DIAGNOSIS — Z0001 Encounter for general adult medical examination with abnormal findings: Secondary | ICD-10-CM

## 2023-05-12 DIAGNOSIS — E7849 Other hyperlipidemia: Secondary | ICD-10-CM

## 2023-05-12 DIAGNOSIS — D72819 Decreased white blood cell count, unspecified: Secondary | ICD-10-CM

## 2023-05-12 DIAGNOSIS — E038 Other specified hypothyroidism: Secondary | ICD-10-CM | POA: Diagnosis not present

## 2023-05-12 DIAGNOSIS — Z1231 Encounter for screening mammogram for malignant neoplasm of breast: Secondary | ICD-10-CM

## 2023-05-12 DIAGNOSIS — Z0000 Encounter for general adult medical examination without abnormal findings: Secondary | ICD-10-CM

## 2023-05-12 DIAGNOSIS — E559 Vitamin D deficiency, unspecified: Secondary | ICD-10-CM | POA: Diagnosis not present

## 2023-05-12 DIAGNOSIS — M79672 Pain in left foot: Secondary | ICD-10-CM

## 2023-05-12 DIAGNOSIS — G8929 Other chronic pain: Secondary | ICD-10-CM

## 2023-05-12 DIAGNOSIS — F411 Generalized anxiety disorder: Secondary | ICD-10-CM

## 2023-05-12 LAB — LIPID PANEL
Cholesterol: 187 mg/dL (ref 0–200)
HDL: 47.5 mg/dL (ref >39.00)
LDL Cholesterol: 112 mg/dL — ABNORMAL HIGH (ref 0–99)
NonHDL: 139.53
Total CHOL/HDL Ratio: 4
Triglycerides: 139 mg/dL (ref 0.0–149.0)
VLDL: 27.8 mg/dL (ref 0.0–40.0)

## 2023-05-12 LAB — HEMOGLOBIN A1C: Hgb A1c MFr Bld: 6.1 % (ref 4.6–6.5)

## 2023-05-12 LAB — CBC WITH DIFFERENTIAL/PLATELET
Basophils Absolute: 0 10*3/uL (ref 0.0–0.1)
Basophils Relative: 0.8 % (ref 0.0–3.0)
Eosinophils Absolute: 0.2 10*3/uL (ref 0.0–0.7)
Eosinophils Relative: 5.4 % — ABNORMAL HIGH (ref 0.0–5.0)
HCT: 41.2 % (ref 36.0–46.0)
Hemoglobin: 13.7 g/dL (ref 12.0–15.0)
Lymphocytes Relative: 39.3 % (ref 12.0–46.0)
Lymphs Abs: 1.2 10*3/uL (ref 0.7–4.0)
MCHC: 33.2 g/dL (ref 30.0–36.0)
MCV: 95.8 fL (ref 78.0–100.0)
Monocytes Absolute: 0.4 10*3/uL (ref 0.1–1.0)
Monocytes Relative: 12.7 % — ABNORMAL HIGH (ref 3.0–12.0)
Neutro Abs: 1.3 10*3/uL — ABNORMAL LOW (ref 1.4–7.7)
Neutrophils Relative %: 41.8 % — ABNORMAL LOW (ref 43.0–77.0)
Platelets: 239 10*3/uL (ref 150.0–400.0)
RBC: 4.3 Mil/uL (ref 3.87–5.11)
RDW: 13.5 % (ref 11.5–15.5)
WBC: 3 10*3/uL — ABNORMAL LOW (ref 4.0–10.5)

## 2023-05-12 LAB — TSH: TSH: 1 u[IU]/mL (ref 0.35–5.50)

## 2023-05-12 LAB — COMPREHENSIVE METABOLIC PANEL
ALT: 13 U/L (ref 0–35)
AST: 20 U/L (ref 0–37)
Albumin: 3.8 g/dL (ref 3.5–5.2)
Alkaline Phosphatase: 77 U/L (ref 39–117)
BUN: 12 mg/dL (ref 6–23)
CO2: 36 meq/L — ABNORMAL HIGH (ref 19–32)
Calcium: 8.8 mg/dL (ref 8.4–10.5)
Chloride: 100 meq/L (ref 96–112)
Creatinine, Ser: 0.85 mg/dL (ref 0.40–1.20)
GFR: 75.29 mL/min (ref >60.00)
Glucose, Bld: 98 mg/dL (ref 70–99)
Potassium: 3.5 meq/L (ref 3.5–5.1)
Sodium: 140 meq/L (ref 135–145)
Total Bilirubin: 0.5 mg/dL (ref 0.2–1.2)
Total Protein: 6.8 g/dL (ref 6.0–8.3)

## 2023-05-12 LAB — VITAMIN D 25 HYDROXY (VIT D DEFICIENCY, FRACTURES): VITD: 36.87 ng/mL (ref 30.00–100.00)

## 2023-05-12 MED ORDER — SEMAGLUTIDE-WEIGHT MANAGEMENT 0.25 MG/0.5ML ~~LOC~~ SOAJ: 0.2500 mg | SUBCUTANEOUS | 0 refills | Status: DC

## 2023-05-12 NOTE — Assessment & Plan Note (Signed)
Controlled.  Remain off Zoloft 50 mg daily. Continue to monitor.

## 2023-05-12 NOTE — Assessment & Plan Note (Signed)
Immunizations UTD. Pap smear UTD. Mammogram due, orders placed. Colonoscopy UTD, due 2029  Discussed the importance of a healthy diet and regular exercise in order for weight loss, and to reduce the risk of further co-morbidity.  Exam stable. Labs pending.  Follow up in 1 year for repeat physical.

## 2023-05-12 NOTE — Progress Notes (Signed)
Subjective:    Patient ID: Vanessa Gardner, female    DOB: 10-Jul-1965, 58 y.o.   MRN: [id number]  HPI  Vanessa Gardner is a very pleasant 58 y.o. female who presents today for complete physical and follow up of chronic conditions.  She would also like to discuss her obesity and weight loss. She is frustrated with her ongoing obesity and weight gain. She's tried Weight Watchers previously, resumed in January 2025. [facility], but never could lose below 245 lbs. Her biggest difficulty is cravings. She is interested in weight loss medication.   Diet currently consists of:  Breakfast: Boiled eggs, [place] bacon, grits Lunch: Chicken, veggies, skips sometimes Dinner: Protein, veggies, occasional starch  Snacks: Popcorn, chips, yogurt  Desserts: Occasionally  Beverages: Coffee, working to increase water intake, blackberry ginger ale  Exercise: None due to recent foot pain.   Immunizations: -Tetanus: Completed in 2017 -Influenza: Completed this season  -Shingles: Completed Shingrix series  Diet: Fair diet.  Exercise: No regular exercise.  Eye exam: Completes annually  Dental exam: Completes semi-annually    Pap Smear: Completed in 2023 Mammogram: Completed in March 2024  Colonoscopy: Completed in 2019, due 2029   BP Readings from Last 3 Encounters:  05/12/23 102/64  12/06/22 133/65  11/25/22 118/66         Review of Systems  Constitutional:  Negative for unexpected weight change.  HENT:  Negative for rhinorrhea.   Respiratory:  Negative for cough and shortness of breath.   Cardiovascular:  Negative for chest pain.  Gastrointestinal:  Negative for constipation and diarrhea.  Genitourinary:  Negative for difficulty urinating.  Musculoskeletal:  Negative for arthralgias and myalgias.  Skin:  Negative for rash.  Allergic/Immunologic: Negative for environmental allergies.  Neurological:  Negative for dizziness,  numbness and headaches.  Psychiatric/Behavioral:  The patient is not nervous/anxious.          Past Medical History:  Diagnosis Date   Allergy    Anxiety    Chicken pox    Essential hypertension    GERD (gastroesophageal reflux disease)    Graves disease    Hypothyroidism    Prediabetes     Social History   Socioeconomic History   Marital status: Married    Spouse name: Vanessa Gardner   Number of children: Not on file   Years of education: Not on file   Highest education level: Not on file  Occupational History   Occupation: Child psychotherapist  Tobacco Use   Smoking status: Never   Smokeless tobacco: Never  Vaping Use   Vaping status: Never Used  Substance and Sexual Activity   Alcohol use: Not Currently    Comment: twice a month-wine   Drug use: No   Sexual activity: Not on file  Other Topics Concern   Not on file  Social History Narrative   ** Merged History Encounter **       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 Enjoys exercising, going to the movies, shopping.    Social Drivers of Corporate investment banker Strain: Not on file  Food Insecurity: Not on file  Transportation Needs: Not on file  Physical Activity: Not on file  Stress: Not on file  Social Connections: Not on file  Intimate Partner Violence: Not on file    Past Surgical History:  Procedure Laterality Date   EYE SURGERY Right 2013   TUBAL LIGATION  Family History  Problem Relation Age of Onset   Hypertension Mother    Kidney disease Mother    Alcohol abuse Father    Hypertension Father    Cirrhosis Father    Rozenberg Cholesterol Father    Heart disease Father    Obesity Father    Throat cancer Maternal Grandfather    Breast cancer Neg Hx    Colon cancer Neg Hx    Stomach cancer Neg Hx    Esophageal cancer Neg Hx     Allergies  Allergen Reactions   Sulfa Antibiotics Anaphylaxis   Cefuroxime Axetil     REACTION: swelling   Norvasc [Amlodipine Besylate] Swelling     Current Outpatient Medications on File Prior to Visit  Medication Sig Dispense Refill   cholecalciferol (VITAMIN D) 1000 units tablet Take 1,000 Units by mouth daily.     levothyroxine (SYNTHROID) 112 MCG tablet Take 1 tablet by mouth every morning on an empty stomach with water only.  No food or other medications for 30 minutes. 90 tablet 2   loratadine (CLARITIN) 10 MG tablet Take 10 mg by mouth daily.     losartan-hydrochlorothiazide (HYZAAR) 50-12.5 MG tablet TAKE 1 TABLET BY MOUTH EVERY DAY FOR BLOOD PRESSURE 90 tablet 0   fluticasone (FLONASE) 50 MCG/ACT nasal spray Place 2 sprays into both nostrils daily. (Patient not taking: Reported on 05/12/2023) 16 g 0   No current facility-administered medications on file prior to visit.    BP 102/64   Pulse 75   Temp (!) 97.3 F (36.3 C) (Temporal)   Ht 5\' 4"  (1.626 m)   Wt 266 lb (120.7 kg)   LMP 07/24/2017   SpO2 99%   BMI 45.66 kg/m  Objective:   Physical Exam HENT:     Right Ear: Tympanic membrane and ear canal normal.     Left Ear: Tympanic membrane and ear canal normal.  Eyes:     Pupils: Pupils are equal, round, and reactive to light.  Cardiovascular:     Rate and Rhythm: Normal rate and regular rhythm.  Pulmonary:     Effort: Pulmonary effort is normal.     Breath sounds: Normal breath sounds.  Abdominal:     General: Bowel sounds are normal.     Palpations: Abdomen is soft.     Tenderness: There is no abdominal tenderness.  Musculoskeletal:        General: Normal range of motion.     Cervical back: Neck supple.  Skin:    General: Skin is warm and dry.  Neurological:     Mental Status: She is alert and oriented to person, place, and time.     Cranial Nerves: No cranial nerve deficit.     Deep Tendon Reflexes:     Reflex Scores:      Patellar reflexes are 2+ on the right side and 2+ on the left side. Psychiatric:        Mood and Affect: Mood normal.           Assessment & Plan:  Encounter for annual  general medical examination with abnormal findings in adult Assessment & Plan: Immunizations UTD. Pap smear UTD. Mammogram due, orders placed. Colonoscopy UTD, due 2029  Discussed the importance of a healthy diet and regular exercise in order for weight loss, and to reduce the risk of further co-morbidity.  Exam stable. Labs pending.  Follow up in 1 year for repeat physical.    Essential hypertension Assessment & Plan: Controlled.  Continue losartan-hydrochlorothiazide 50-12.5 mg daily CMP pending.  Orders: -     Comprehensive metabolic panel -     CBC with Differential/Platelet -     Semaglutide-Weight Management; Inject 0.25 mg into the skin once a week.  Dispense: 2 mL; Refill: 0  Other specified hypothyroidism Assessment & Plan: She is taking levothyroxine correctly.  Continue levothyroxine 112 mcg daily. Repeat TSH pending.  Orders: -     TSH  Chronic foot pain, left Assessment & Plan: Follow-up podiatry.  Continue to monitor.   Class 3 severe obesity due to excess calories without serious comorbidity with body mass index (BMI) of 45.0 to 49.9 in adult [facility]) Assessment & Plan: Discussed options for treatment.  She opts for GLP-1 agonist medication.  Recommended she continue weight watchers.  Start semaglutide [facility]) for weight loss. Start by injecting 0.25 mg into the skin once weekly for 4 weeks, then increase to 0.5 mg once weekly thereafter.  Please notify me once you have used your last 0.25 mg pen so that I can send the 0.5 mg dose to your pharmacy.  We discussed instructions for initiation and potential side effects.  Close follow-up in 3 months. Labs pending today.  Orders: -     Semaglutide-Weight Management; Inject 0.25 mg into the skin once a week.  Dispense: 2 mL; Refill: 0  GAD (generalized anxiety disorder) Assessment & Plan: Controlled.  Remain off Zoloft 50 mg daily. Continue to monitor.   Other hyperlipidemia Assessment &  Plan: Repeat lipid panel pending.  Orders: -     Lipid panel -     Semaglutide-Weight Management; Inject 0.25 mg into the skin once a week.  Dispense: 2 mL; Refill: 0  Prediabetes Assessment & Plan: Repeat A1c pending.  Orders: -     Hemoglobin A1c -     Semaglutide-Weight Management; Inject 0.25 mg into the skin once a week.  Dispense: 2 mL; Refill: 0  Vitamin D deficiency Assessment & Plan: Repeat vitamin D level pending. Currently not on supplementation.  Orders: -     VITAMIN D 25 Hydroxy (Vit-D Deficiency, Fractures)  Screening mammogram for breast cancer -     3D Screening Mammogram, Left and Right; Future        Doreene Nest, NP

## 2023-05-12 NOTE — Assessment & Plan Note (Signed)
Repeat vitamin D level pending. Currently not on supplementation.

## 2023-05-12 NOTE — Assessment & Plan Note (Signed)
Follow-up podiatry.  Continue to monitor.

## 2023-05-12 NOTE — Assessment & Plan Note (Signed)
 Repeat A1c pending

## 2023-05-12 NOTE — Assessment & Plan Note (Signed)
 Repeat lipid panel pending.

## 2023-05-12 NOTE — Assessment & Plan Note (Signed)
Controlled.  Continue losartan-hydrochlorothiazide 50-12.5 mg daily. CMP pending.

## 2023-05-12 NOTE — Assessment & Plan Note (Signed)
Discussed options for treatment.  She opts for GLP-1 agonist medication.  Recommended she continue weight watchers.  Start semaglutide [facility]) for weight loss. Start by injecting 0.25 mg into the skin once weekly for 4 weeks, then increase to 0.5 mg once weekly thereafter.  Please notify me once you have used your last 0.25 mg pen so that I can send the 0.5 mg dose to your pharmacy.  We discussed instructions for initiation and potential side effects.  Close follow-up in 3 months. Labs pending today.

## 2023-05-12 NOTE — Patient Instructions (Signed)
Stop by the lab prior to leaving today. I will notify you of your results once received.   Start semaglutide [facility]) for weight loss. Start by injecting 0.25 mg into the skin once weekly for 4 weeks, then increase to 0.5 mg once weekly thereafter.  Please notify me once you have used your last 0.25 mg pen so that I can send the 0.5 mg dose to your pharmacy.  [facility].   Please schedule a follow up visit for 3 months.  It was a pleasure to see you today!

## 2023-05-12 NOTE — Assessment & Plan Note (Signed)
She is taking levothyroxine correctly.   Continue levothyroxine 112 mcg daily.  Repeat TSH pending. 

## 2023-05-12 NOTE — Telephone Encounter (Signed)
Pharmacy Patient Advocate Encounter   Received notification from  [employer] that prior authorization for [facility] 0.25MG /0.5ML auto-injectors is required/requested.   Insurance verification completed.   The patient is insured through [employer] [facility] .   Per test claim: PA required; PA submitted to above mentioned insurance via CoverMyMeds Key/confirmation #/EOC [id number] Status is pending

## 2023-05-13 ENCOUNTER — Other Ambulatory Visit: Payer: Self-pay | Admitting: Primary Care

## 2023-05-13 DIAGNOSIS — I10 Essential (primary) hypertension: Secondary | ICD-10-CM

## 2023-05-13 DIAGNOSIS — E7849 Other hyperlipidemia: Secondary | ICD-10-CM

## 2023-05-13 DIAGNOSIS — R7303 Prediabetes: Secondary | ICD-10-CM

## 2023-05-15 NOTE — Telephone Encounter (Signed)
 Pharmacy Patient Advocate Encounter  Received notification from [facility] that Prior Authorization for [facility] 0.25MG /0.5ML auto-injectors has been DENIED.  See denial reason below. No denial letter attached in CMM. Will attach denial letter to Media tab once received.   PA #/Case ID/Reference #:  [id number]

## 2023-05-16 DIAGNOSIS — E559 Vitamin D deficiency, unspecified: Secondary | ICD-10-CM

## 2023-05-16 DIAGNOSIS — E66813 Obesity, class 3: Secondary | ICD-10-CM

## 2023-05-16 DIAGNOSIS — R7303 Prediabetes: Secondary | ICD-10-CM

## 2023-05-16 DIAGNOSIS — E7849 Other hyperlipidemia: Secondary | ICD-10-CM

## 2023-05-16 DIAGNOSIS — I10 Essential (primary) hypertension: Secondary | ICD-10-CM

## 2023-05-16 NOTE — Telephone Encounter (Signed)
 Please call patient:  Let her know that Reginal Lutes was denied by insurance. She is welcome to call them to find out the specific reason. She can see if anything else would be approved such as Zepbound.

## 2023-05-16 NOTE — Telephone Encounter (Signed)
 See patient my chart message.

## 2023-05-16 NOTE — Telephone Encounter (Signed)
 Please have the prior authorization team to resubmit for the [facility].  Her insurance company will cover for attempts at weight loss without weight loss medication.  This is clearly documented in my notes.

## 2023-05-16 NOTE — Telephone Encounter (Signed)
 Called patient and reviewed all information. Patient verbalized understanding. Will call if any further questions.

## 2023-05-17 ENCOUNTER — Other Ambulatory Visit (HOSPITAL_COMMUNITY): Payer: Self-pay

## 2023-05-17 ENCOUNTER — Telehealth: Payer: Self-pay | Admitting: Pharmacist

## 2023-05-17 NOTE — Telephone Encounter (Signed)
 Appeal has been submitted for [facility]. Will advise when response is received, please be advised that most companies may take 30 days to make a decision. Appeal letter and supporting documentation were uploaded and submitted via e-appeal on [facility] website. [id number]  Thank you, Dellie Burns, PharmD Clinical Pharmacist  [place]  Direct Dial: [phone]

## 2023-05-18 NOTE — Telephone Encounter (Signed)
 Additional information has been requested from the patient's insurance in order to proceed with the prior authorization request. Requested information has been sent, or form has been filled out and faxed back to [phone]

## 2023-05-18 NOTE — Telephone Encounter (Signed)
 Appeal has been approved for  [employer]. The authorization is valid from 04/17/2023 through 11/13/2023.  Thanks!

## 2023-05-22 ENCOUNTER — Encounter: Payer: Self-pay | Admitting: Internal Medicine

## 2023-05-22 ENCOUNTER — Inpatient Hospital Stay: Payer: Federal, State, Local not specified - PPO | Attending: Internal Medicine | Admitting: Internal Medicine

## 2023-05-22 ENCOUNTER — Inpatient Hospital Stay: Payer: Federal, State, Local not specified - PPO

## 2023-05-22 VITALS — BP 143/76 | HR 78 | Temp 99.0°F | Resp 18 | Ht 64.0 in | Wt 265.8 lb

## 2023-05-22 DIAGNOSIS — E039 Hypothyroidism, unspecified: Secondary | ICD-10-CM | POA: Insufficient documentation

## 2023-05-22 DIAGNOSIS — Z808 Family history of malignant neoplasm of other organs or systems: Secondary | ICD-10-CM | POA: Insufficient documentation

## 2023-05-22 DIAGNOSIS — Z79890 Hormone replacement therapy (postmenopausal): Secondary | ICD-10-CM | POA: Insufficient documentation

## 2023-05-22 DIAGNOSIS — D709 Neutropenia, unspecified: Secondary | ICD-10-CM | POA: Insufficient documentation

## 2023-05-22 DIAGNOSIS — D708 Other neutropenia: Secondary | ICD-10-CM

## 2023-05-22 NOTE — Assessment & Plan Note (Addendum)
#   Leukopenia- 3- 3.8 ANC 1.3- normal hemoglobin/platelets. Patient is asymptomatic-no increased risk of infections. Suspect benign causes rather than any malignant causes.   # Discussed possibility of medication induced; intrinsic bone marrow failure states like [aplastic leukemia hairy cell leukemia- neutropenia with monocytopenia].  However the fact the patient had intermittent low counts for the last 2 to 3 years without any other abnormalities of the CBC is extremely unlikely to be any malignant cause. No evidence of hepatosplenomegaly or liver disease.   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  Or a component of her history of autoimmune spectrum [history of Graves' disease].  Denies a situation patient is asymptomatic.  No risk factors for HIV/hepatitis.   # I think it is reasonable to hold any further workup at this time.  However if patient's blood counts continue to run low or if patient is symptomatic with increased infection recommend further workup including the possibility/need for a bone marrow biopsy.   # hx Graves dx [2009; s/p RAIU] - currently hypothyroidism on synthroid  Thank you Ms.Clarke  for allowing me to participate in the care of your pleasant patient. Please do not hesitate to contact me with questions or concerns in the interim.  # DISPOSITION: # NO labs-  # follow up as needed- -Dr.B

## 2023-05-22 NOTE — Progress Notes (Signed)
 Fever: NO Chills: NO  Body aches: NO Headaches: NO Fatigue: NO Shortness of breath or coughing: NO Mouth ulcers: NO Rashes and frequent itching: NO

## 2023-05-22 NOTE — Progress Notes (Signed)
 Vanessa Gardner CONSULT NOTE  Patient Care Team: Doreene Nest, NP as PCP - General (Internal Medicine) Chestine Spore Keane Scrape, NP (Internal Medicine) Earna Coder, MD as Consulting Physician (Oncology)  # CHIEF COMPLAINTS/PURPOSE OF CONSULTATION: Leucopenia  # LEUCOPENIA/NEUTROPENIA- ANC; Hb; platelets; CT Ab/US; Hepatitis/HIV; Alcohol  #  Oncology History   No history exists.     HISTORY OF PRESENTING ILLNESS:  Patient ambulating-independently.  Alone.   Vanessa Gardner 58 y.o.  female with with no significant past medical except hx of hypothyroidism on synthroid is here for further evaluation leucopenia.   Patient noted to have low white count incidentally on blood work.  Patient denies any unusual symptoms of the time of the blood test.   Frequent infections [pneumonias/sinus infection/UTI]- none  Early satiety: None Lymph node enlargement: None  Weight loss: none  Skin rash: none Autoimmune: Hx of Graves- Gastric bypass: none  Medications: NSAIDs/antiepileptics-none. Alcohol: rare Family History/Ancestry: sister- had low platelet [? Sec to COVID]  Review of Systems  Constitutional:  Negative for chills, diaphoresis, fever, malaise/fatigue and weight loss.  HENT:  Negative for nosebleeds and sore throat.   Eyes:  Negative for double vision.  Respiratory:  Negative for cough, hemoptysis, sputum production, shortness of breath and wheezing.   Cardiovascular:  Negative for chest pain, palpitations, orthopnea and leg swelling.  Gastrointestinal:  Negative for abdominal pain, blood in stool, constipation, diarrhea, heartburn, melena, nausea and vomiting.  Genitourinary:  Negative for dysuria, frequency and urgency.  Musculoskeletal:  Negative for back pain and joint pain.  Skin: Negative.  Negative for itching and rash.  Neurological:  Negative for dizziness, tingling, focal weakness, weakness and headaches.  Endo/Heme/Allergies:  Does not  bruise/bleed easily.  Psychiatric/Behavioral:  Negative for depression. The patient is not nervous/anxious and does not have insomnia.      MEDICAL HISTORY:  Past Medical History:  Diagnosis Date   Allergy    Anxiety    Chicken pox    Essential hypertension    GERD (gastroesophageal reflux disease)    Graves disease    Hypothyroidism    Prediabetes     SURGICAL HISTORY: Past Surgical History:  Procedure Laterality Date   EYE SURGERY Right 2013   TUBAL LIGATION      SOCIAL HISTORY: Social History   Socioeconomic History   Marital status: Married    Spouse name: Ethelene Browns Najera   Number of children: Not on file   Years of education: Not on file   Highest education level: Not on file  Occupational History   Occupation: Child psychotherapist  Tobacco Use   Smoking status: Never   Smokeless tobacco: Never  Vaping Use   Vaping status: Never Used  Substance and Sexual Activity   Alcohol use: Yes    Comment: twice a month-wine   Drug use: No   Sexual activity: Not on file  Other Topics Concern   Not on file  Social History Narrative   ** Merged History Encounter **       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 Enjoys exercising, going to the movies, shopping.    Social Drivers of Corporate investment banker Strain: Not on file  Food Insecurity: No Food Insecurity (05/22/2023)   Hunger Vital Sign    Worried About Running Out of Food in the Last Year: Never true    Ran Out of Food in the Last Year: Never true  Transportation Needs: No Transportation Needs (05/22/2023)  PRAPARE - Administrator, Civil Service (Medical): No    Lack of Transportation (Non-Medical): No  Physical Activity: Not on file  Stress: Not on file  Social Connections: Not on file  Intimate Partner Violence: Not At Risk (05/22/2023)   Humiliation, Afraid, Rape, and Kick questionnaire    Fear of Current or Ex-Partner: No    Emotionally Abused: No    Physically Abused: No    Sexually  Abused: No    FAMILY HISTORY: Family History  Problem Relation Age of Onset   Hypertension Mother    Kidney disease Mother    Alcohol abuse Father    Hypertension Father    Cirrhosis Father    [place]loss Cholesterol Father    Heart disease Father    Obesity Father    Throat cancer Maternal Grandfather    Breast cancer Neg Hx    Colon cancer Neg Hx    Stomach cancer Neg Hx    Esophageal cancer Neg Hx     ALLERGIES:  is allergic to sulfa antibiotics, cefuroxime axetil, and norvasc [amlodipine besylate].  MEDICATIONS:  Current Outpatient Medications  Medication Sig Dispense Refill   cholecalciferol (VITAMIN D) 1000 units tablet Take 2,000 Units by mouth daily.     levothyroxine (SYNTHROID) 112 MCG tablet Take 1 tablet by mouth every morning on an empty stomach with water only.  No food or other medications for 30 minutes. 90 tablet 2   loratadine (CLARITIN) 10 MG tablet Take 10 mg by mouth daily.     losartan-hydrochlorothiazide (HYZAAR) 50-12.5 MG tablet TAKE 1 TABLET BY MOUTH EVERY DAY FOR BLOOD PRESSURE 90 tablet 0   meloxicam (MOBIC) 7.5 MG tablet Take 7.5 mg by mouth daily as needed for pain.     No current facility-administered medications for this visit.      PHYSICAL EXAMINATION:  Vitals:   05/22/23 1407  BP: (!) 143/76  Pulse: 78  Resp: 18  Temp: 99 F (37.2 C)  SpO2: 100%   Filed Weights   05/22/23 1407  Weight: 265 lb 12.8 oz (120.6 kg)    Physical Exam Vitals and nursing note reviewed.  HENT:     Head: Normocephalic and atraumatic.     Mouth/Throat:     Pharynx: Oropharynx is clear.  Eyes:     Extraocular Movements: Extraocular movements intact.     Pupils: Pupils are equal, round, and reactive to light.  Cardiovascular:     Rate and Rhythm: Normal rate and regular rhythm.  Pulmonary:     Comments: Decreased breath sounds bilaterally.  Abdominal:     Palpations: Abdomen is soft.  Musculoskeletal:        General: Normal range of motion.      Cervical back: Normal range of motion.  Skin:    General: Skin is warm.  Neurological:     General: No focal deficit present.     Mental Status: She is alert and oriented to person, place, and time.  Psychiatric:        Behavior: Behavior normal.        Judgment: Judgment normal.      LABORATORY DATA:  I have reviewed the data as listed Lab Results  Component Value Date   WBC 3.0 (L) 05/12/2023   HGB 13.7 05/12/2023   HCT 41.2 05/12/2023   MCV 95.8 05/12/2023   PLT 239.0 05/12/2023   Recent Labs    05/12/23 0919  NA 140  K 3.5  CL 100  CO2 36*  GLUCOSE 98  BUN 12  CREATININE 0.85  CALCIUM 8.8  PROT 6.8  ALBUMIN 3.8  AST 20  ALT 13  ALKPHOS 77  BILITOT 0.5    RADIOGRAPHIC STUDIES: I have personally reviewed the radiological images as listed and agreed with the findings in the report. No results found.  ASSESSMENT & PLAN:   Neutropenia, unspecified (HCC) # Leukopenia- 3- 3.8 ANC 1.3- normal hemoglobin/platelets. Patient is asymptomatic-no increased risk of infections. Suspect benign causes rather than any malignant causes.   # Discussed possibility of medication induced; intrinsic bone marrow failure states like [aplastic leukemia hairy cell leukemia- neutropenia with monocytopenia].  However the fact the patient had intermittent low counts for the last 2 to 3 years without any other abnormalities of the CBC is extremely unlikely to be any malignant cause. No evidence of hepatosplenomegaly or liver disease.   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  Or a component of her history of autoimmune spectrum [history of Graves' disease].  Denies a situation patient is asymptomatic.  No risk factors for HIV/hepatitis.   # I think it is reasonable to hold any further workup at this time.  However if patient's blood counts continue to run low or if patient is symptomatic with increased infection recommend further workup including the possibility/need for a bone  marrow biopsy.   # hx Graves dx [2009; s/p RAIU] - currently hypothyroidism on synthroid  Thank you Ms.Clarke  for allowing me to participate in the care of your pleasant patient. Please do not hesitate to contact me with questions or concerns in the interim.  # DISPOSITION: # NO labs-  # follow up as needed- -Dr.B    All questions were answered. The patient knows to call the clinic with any problems, questions or concerns.      Earna Coder, MD 05/22/2023 4:12 PM

## 2023-06-27 ENCOUNTER — Encounter (INDEPENDENT_AMBULATORY_CARE_PROVIDER_SITE_OTHER): Payer: Self-pay

## 2023-07-03 DIAGNOSIS — Z0289 Encounter for other administrative examinations: Secondary | ICD-10-CM

## 2023-07-04 ENCOUNTER — Encounter (INDEPENDENT_AMBULATORY_CARE_PROVIDER_SITE_OTHER): Admitting: Physician Assistant

## 2023-07-06 ENCOUNTER — Ambulatory Visit: Payer: Self-pay

## 2023-07-06 NOTE — Telephone Encounter (Signed)
 Copied from CRM [id number]. Topic: Clinical - Red Word Triage >> Jul 06, 2023  8:14 AM Rosaria Common wrote: Red Word that prompted transfer to Nurse Triage: Right shoulder pain for about 2 weeks now. Worse at night or when in use. 4 out of 5 pain.  Chief Complaint: right shoulder pain Symptoms: pain Frequency: comes and goes Pertinent Negatives: Patient denies injury, swelling, numbness, tingling Disposition: [] ED /[] Urgent Care (no appt availability in office) / [x] Appointment(In office/virtual)/ []   [facility]/ [] Home Care/ [] Refused Recommended Disposition /[] [facility]/ []  Follow-up with PCP Additional Notes: per protocol apt made; care advice given, denies questions; instructed to go to ER if becomes worse.   Reason for Disposition  [1] MODERATE pain (e.g., interferes with normal activities) AND [2] present > 3 days  Answer Assessment - Initial Assessment Questions 1. ONSET: "When did the pain start?"     2 weeks ago 2. LOCATION: "Where is the pain located?"     Right shoulder 3. PAIN: "How bad is the pain?" (Scale 1-10; or mild, moderate, severe)   - MILD (1-3): doesn't interfere with normal activities   - MODERATE (4-7): interferes with normal activities (e.g., work or school) or awakens from sleep   - SEVERE (8-10): excruciating pain, unable to do any normal activities, unable to move arm at all due to pain     5/10 comes and goes 4. WORK OR EXERCISE: "Has there been any recent work or exercise that involved this part of the body?"     denies 5. CAUSE: "What do you think is causing the shoulder pain?"     unknown 6. OTHER SYMPTOMS: "Do you have any other symptoms?" (e.g., neck pain, swelling, rash, fever, numbness, weakness)     denies 7. PREGNANCY: "Is there any chance you are pregnant?" "When was your last menstrual period?"     na  Protocols used: Shoulder Pain-A-AH

## 2023-07-06 NOTE — Telephone Encounter (Signed)
 Noted.

## 2023-07-11 NOTE — Progress Notes (Unsigned)
     [name] Aashrith [name], MD, CAQ Sports Medicine [facility] at [facility] [address] [place], [address]  Phone: [phone]  FAX: [phone]  Vanessa Gardner - 58 y.o. female  MRN [id number]  Date of Birth: 1965/12/10  Date: 07/12/2023  PCP: Gabriel John, NP  Referral: Gabriel John, NP  No chief complaint on file.  Subjective:   Vanessa Gardner is a 58 y.o. very pleasant female patient with There is no height or weight on file to calculate BMI. who presents with the following:  Patient presents with ongoing shoulder pain.    Review of Systems is noted in the HPI, as appropriate  Objective:   LMP 07/24/2017   GEN: No acute distress; alert,appropriate. PULM: Breathing comfortably in no respiratory distress PSYCH: Normally interactive.   Laboratory and Imaging Data:  Assessment and Plan:   ***

## 2023-07-12 ENCOUNTER — Ambulatory Visit: Admitting: Family Medicine

## 2023-07-12 ENCOUNTER — Encounter: Payer: Self-pay | Admitting: Family Medicine

## 2023-07-12 VITALS — BP 132/70 | HR 65 | Temp 98.4°F | Ht 64.0 in | Wt 271.0 lb

## 2023-07-12 DIAGNOSIS — M25511 Pain in right shoulder: Secondary | ICD-10-CM

## 2023-07-12 MED ORDER — TRIAMCINOLONE ACETONIDE 40 MG/ML IJ SUSP
20.0000 mg | Freq: Once | INTRAMUSCULAR | Status: AC
  Administered 2023-07-12: 20 mg via INTRA_ARTICULAR

## 2023-07-26 ENCOUNTER — Other Ambulatory Visit: Payer: Self-pay | Admitting: Primary Care

## 2023-07-26 DIAGNOSIS — I10 Essential (primary) hypertension: Secondary | ICD-10-CM

## 2023-07-31 ENCOUNTER — Ambulatory Visit: Admitting: Primary Care

## 2023-07-31 ENCOUNTER — Encounter: Payer: Self-pay | Admitting: Primary Care

## 2023-07-31 VITALS — BP 128/76 | HR 61 | Temp 97.3°F | Ht 64.0 in | Wt 265.0 lb

## 2023-07-31 DIAGNOSIS — N950 Postmenopausal bleeding: Secondary | ICD-10-CM | POA: Insufficient documentation

## 2023-07-31 NOTE — Assessment & Plan Note (Signed)
 Abnormal given post menopausal status and age.  Will obtain pelvic/transvaginal US  for further evaluation. She agrees. Orders placed.   Await results.

## 2023-07-31 NOTE — Patient Instructions (Signed)
 You will receive a phone call regarding the ultrasound.  Keep a journal of your bleeding in case it continues.  It was a pleasure to see you today!

## 2023-07-31 NOTE — Progress Notes (Signed)
 Subjective:    Patient ID: Vanessa Gardner, female    DOB: [birth year], 58 y.o.   MRN: [id number]  Vaginal Bleeding The patient's primary symptoms include pelvic pain. Pertinent negatives include no abdominal pain, dysuria, fever, flank pain or hematuria.    Vanessa Gardner is a very pleasant 58 y.o. female with a history of hypertension, hypothyroidism, prediabetes, menopause, leukopenia who presents today to discuss vaginal bleeding.  Her last menstrual period was about 5 years ago. Over the last 3-4 days she's noticed some mild dark brown vaginal bleeding/spotting. Yesterday she noticed intermittent bilateral pelvic pain and noticed a "gush" of bright red vaginal bleeding shortly after. This morning she's noticed dark vaginal bleeding and bright red bleeding. She is wearing a pad now.   She has not had sexual intercourse recently. She's taken some Aleve for her cramping. She denies a family history endometrial/uterine/cervical cancer. She denies dysuria. Her last PAP smear was in February 2023 which was NILM. No prior history of abnormal pap smears.    Review of Systems  Constitutional:  Negative for fever.  Gastrointestinal:  Negative for abdominal pain.  Genitourinary:  Positive for pelvic pain and vaginal bleeding. Negative for dysuria, flank pain and hematuria.         Past Medical History:  Diagnosis Date   Allergy    Anxiety    Chicken pox    Essential hypertension    GERD (gastroesophageal reflux disease)    Graves disease    Hypothyroidism    Prediabetes     Social History   Socioeconomic History   Marital status: Married    Spouse name: Arnetta Lank Atha   Number of children: Not on file   Years of education: Not on file   Highest education level: Bachelor's degree (e.g., BA, AB, BS)  Occupational History   Occupation: Child psychotherapist  Tobacco Use   Smoking status: Never   Smokeless tobacco: Never  Vaping Use   Vaping status: Never Used  Substance and  Sexual Activity   Alcohol use: Yes    Comment: twice a month-wine   Drug use: No   Sexual activity: Not on file  Other Topics Concern   Not on file  Social History Narrative   ** Merged History Encounter **       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 Enjoys exercising, going to the movies, shopping.    Social Drivers of Corporate investment banker Strain: Low Risk  (07/11/2023)   Overall Financial Resource Strain (CARDIA)    Difficulty of Paying Living Expenses: Not very hard  Food Insecurity: No Food Insecurity (07/11/2023)   Hunger Vital Sign    Worried About Running Out of Food in the Last Year: Never true    Ran Out of Food in the Last Year: Never true  Transportation Needs: No Transportation Needs (07/11/2023)   PRAPARE - Administrator, Civil Service (Medical): No    Lack of Transportation (Non-Medical): No  Physical Activity: Insufficiently Active (07/11/2023)   Exercise Vital Sign    Days of Exercise per Week: 2 days    Minutes of Exercise per Session: 60 min  Stress: No Stress Concern Present (07/11/2023)   [employer] of Occupational Health - Occupational Stress Questionnaire    Feeling of Stress : Not at all  Social Connections: Moderately Integrated (07/11/2023)   Social Connection and Isolation Panel [NHANES]    Frequency of Communication with Friends and Family: More than three  times a week    Frequency of Social Gatherings with Friends and Family: Twice a week    Attends Religious Services: More than 4 times per year    Active Member of [employer] or Organizations: No    Attends Engineer, structural: Not on file    Marital Status: Married  Catering manager Violence: Not At Risk (05/22/2023)   Humiliation, Afraid, Rape, and Kick questionnaire    Fear of Current or Ex-Partner: No    Emotionally Abused: No    Physically Abused: No    Sexually Abused: No    Past Surgical History:  Procedure Laterality Date   EYE SURGERY Right 2013    TUBAL LIGATION      Family History  Problem Relation Age of Onset   Hypertension Mother    Kidney disease Mother    Alcohol abuse Father    Hypertension Father    Cirrhosis Father    Goodness Cholesterol Father    Heart disease Father    Obesity Father    Throat cancer Maternal Grandfather    Breast cancer Neg Hx    Colon cancer Neg Hx    Stomach cancer Neg Hx    Esophageal cancer Neg Hx     Allergies  Allergen Reactions   Sulfa Antibiotics Anaphylaxis   Cefuroxime Axetil     REACTION: swelling   Norvasc [Amlodipine Besylate] Swelling    Current Outpatient Medications on File Prior to Visit  Medication Sig Dispense Refill   cholecalciferol (VITAMIN D ) 1000 units tablet Take 2,000 Units by mouth daily.     levothyroxine  (SYNTHROID ) 112 MCG tablet Take 1 tablet by mouth every morning on an empty stomach with water only.  No food or other medications for 30 minutes. 90 tablet 2   loratadine (CLARITIN) 10 MG tablet Take 10 mg by mouth daily.     losartan -hydrochlorothiazide  (HYZAAR) 50-12.5 MG tablet TAKE 1 TABLET BY MOUTH EVERY DAY FOR BLOOD PRESSURE 90 tablet 2   meloxicam  (MOBIC ) 15 MG tablet Take 15 mg by mouth daily as needed. (Patient not taking: Reported on 07/31/2023)     No current facility-administered medications on file prior to visit.    BP 128/76   Pulse 61   Temp (!) 97.3 F (36.3 C) (Temporal)   Ht 5\' 4"  (1.626 m)   Wt 265 lb (120.2 kg)   LMP 07/24/2017   SpO2 99%   BMI 45.49 kg/m  Objective:   Physical Exam Cardiovascular:     Rate and Rhythm: Normal rate and regular rhythm.  Pulmonary:     Effort: Pulmonary effort is normal.     Breath sounds: Normal breath sounds.  Abdominal:     Palpations: Abdomen is soft.     Tenderness: There is no abdominal tenderness.  Musculoskeletal:     Cervical back: Neck supple.  Skin:    General: Skin is warm and dry.  Neurological:     Mental Status: She is alert and oriented to person, place, and time.   Psychiatric:        Mood and Affect: Mood normal.           Assessment & Plan:  Post-menopausal bleeding Assessment & Plan: Abnormal given post menopausal status and age.  Will obtain pelvic/transvaginal US  for further evaluation. She agrees. Orders placed.   Await results.   Orders: -     US  PELVIC COMPLETE WITH TRANSVAGINAL; Future        Narvel Kozub K Dayanira Giovannetti, NP

## 2023-07-31 NOTE — Telephone Encounter (Signed)
 Patient needs an office visit.

## 2023-08-01 ENCOUNTER — Ambulatory Visit
Admission: RE | Admit: 2023-08-01 | Discharge: 2023-08-01 | Disposition: A | Discharge status: Home / Self Care | Source: Ambulatory Visit | Attending: Primary Care | Admitting: Primary Care

## 2023-08-01 DIAGNOSIS — R9389 Abnormal findings on diagnostic imaging of other specified body structures: Secondary | ICD-10-CM | POA: Diagnosis not present

## 2023-08-01 DIAGNOSIS — D259 Leiomyoma of uterus, unspecified: Secondary | ICD-10-CM | POA: Diagnosis not present

## 2023-08-01 DIAGNOSIS — N950 Postmenopausal bleeding: Secondary | ICD-10-CM

## 2023-08-02 ENCOUNTER — Ambulatory Visit: Payer: Self-pay | Admitting: Primary Care

## 2023-08-02 ENCOUNTER — Ambulatory Visit (INDEPENDENT_AMBULATORY_CARE_PROVIDER_SITE_OTHER): Admitting: Family Medicine

## 2023-08-02 ENCOUNTER — Encounter (INDEPENDENT_AMBULATORY_CARE_PROVIDER_SITE_OTHER): Payer: Self-pay | Admitting: Family Medicine

## 2023-08-02 VITALS — BP 116/72 | HR 66 | Temp 98.0°F | Ht 64.0 in | Wt 262.0 lb

## 2023-08-02 DIAGNOSIS — R5383 Other fatigue: Secondary | ICD-10-CM | POA: Diagnosis not present

## 2023-08-02 DIAGNOSIS — E66813 Obesity, class 3: Secondary | ICD-10-CM

## 2023-08-02 DIAGNOSIS — E038 Other specified hypothyroidism: Secondary | ICD-10-CM

## 2023-08-02 DIAGNOSIS — R0602 Shortness of breath: Secondary | ICD-10-CM

## 2023-08-02 DIAGNOSIS — Z6841 Body Mass Index (BMI) 40.0 and over, adult: Secondary | ICD-10-CM

## 2023-08-02 DIAGNOSIS — E559 Vitamin D deficiency, unspecified: Secondary | ICD-10-CM

## 2023-08-02 DIAGNOSIS — R7303 Prediabetes: Secondary | ICD-10-CM | POA: Diagnosis not present

## 2023-08-02 DIAGNOSIS — Z1331 Encounter for screening for depression: Secondary | ICD-10-CM | POA: Diagnosis not present

## 2023-08-02 DIAGNOSIS — I10 Essential (primary) hypertension: Secondary | ICD-10-CM

## 2023-08-02 DIAGNOSIS — E039 Hypothyroidism, unspecified: Secondary | ICD-10-CM | POA: Diagnosis not present

## 2023-08-02 DIAGNOSIS — N950 Postmenopausal bleeding: Secondary | ICD-10-CM

## 2023-08-02 NOTE — Assessment & Plan Note (Signed)
 Patient has had hypertension for a long time- can't recall when she diagnosed.  She is on losartan -hydrochlorothiazide  daily.  BP controlled today.  No chest pain, chest pressure or headache.  Continue current dose of medication at this time.

## 2023-08-02 NOTE — Assessment & Plan Note (Signed)
 Last TSH within normal limits but lower end of normal.  Repeat level in 6 months.

## 2023-08-02 NOTE — Assessment & Plan Note (Signed)
 Last Vitamin D  level of 36.  On 2k international units daily.  Continue Vitamin D  OTC.  No nausea, vomiting, or muscle weakness.  Continue vitamin d  otc.

## 2023-08-02 NOTE — Assessment & Plan Note (Signed)
 Recent A1c of 6.1 which has gone up over the last two years.  She is not exercising the same as she did previously.  Insulin  level ordered today.

## 2023-08-02 NOTE — Progress Notes (Signed)
 Chief Complaint:  Obesity   Subjective:  Vanessa Gardner (MR# [id number]) is a 58 y.o. female who presents for evaluation and treatment of obesity and related comorbidities. Returning patient- patient retired, daughter had a preemie born at 73 weeks and she is ready to get back on plan with her health.   Vanessa Gardner is currently in the action stage of change and ready to dedicate time achieving and maintaining a healthier weight. Vanessa Gardner is interested in becoming our patient and working on intensive lifestyle modifications including (but not limited to) diet and exercise for weight loss.  Previously did well on Weight Watchers when she had to go to the meeting before Covid.  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  Vanessa Gardner has been struggling with her weight. She has been unsuccessful in either losing weight, maintaining weight loss, or reaching her healthy weight goal.  She lives at home in [place] with her husband Vanessa Gardner- he is supportive of her, eats meals with her and will be changing how he eats with her.  Desired weight is 185lb- last time she was that weight was right after college. Last few years she has been able to get to 250 but then can't break thru that.  Fast food or take out at [facility] 3-4 times a week.   Food Recall: Coffee in the am with creamer and raw sugar- 1 tsp creamer.  Doesn't eat at that time then fix 2 boiled eggs and 2 strips of [place] bacon.  Lunch is hit or miss- will try to put something in the crock pot and then have chicken and some salad.  She had shrimp, chicken apple sausage previously.  She does have a cobb salad from [employer] occasionally.   Indirect Calorimeter completed today shows a RMR: 1685.  Her calculated basal metabolic rate is 1478 thus her basal metabolic rate is worse than expected.  Other Fatigue Cressie denies daytime somnolence and denies waking up still tired. [name] generally gets 6 hours  of sleep per night, and states that she has generally restful sleep. Snoring is present. Apneic episodes are not present. Epworth Sleepiness Score is 2.   Shortness of Breath Grisela notes increasing shortness of breath with exercising and seems to be worsening over time with weight gain. She notes getting out of breath sooner with activity than she used to. This has not gotten worse recently. Jashawna denies shortness of breath at rest or orthopnea.  Depression Screen Carling's Food and Mood (modified PHQ-9) score was 10.     08/02/2023    8:39 AM  Depression screen PHQ 2/9  Decreased Interest 0  Down, Depressed, Hopeless 0  PHQ - 2 Score 0  Altered sleeping 0  Tired, decreased energy 0  Change in appetite 0  Feeling bad or failure about yourself  0  Trouble concentrating 0  Moving slowly or fidgety/restless 0  Suicidal thoughts 0  PHQ-9 Score 0  Difficult doing work/chores Not difficult at all     Objective:  No data recorded       08/02/2023    8:00 AM 07/31/2023   10:58 AM 07/12/2023    2:22 PM  Vitals with BMI  Height 5\' 4"  5\' 4"  5\' 4"   Weight 262 lbs 265 lbs 271 lbs  BMI 44.95 45.46 46.49  Systolic 116 128 295  Diastolic 72 76 70  Pulse 66 61 65     EKG:  Normal sinus rhythm, rate 56.  General: Cooperative, alert, well developed, in no acute distress. HEENT: Conjunctivae and lids unremarkable. Cardiovascular: Regular rhythm.  Lungs: Normal work of breathing. Neurologic: No focal deficits.   Lab Results  Component Value Date   CREATININE 0.85 05/12/2023   BUN 12 05/12/2023   NA 140 05/12/2023   K 3.5 05/12/2023   CL 100 05/12/2023   CO2 36 (H) 05/12/2023   Lab Results  Component Value Date   ALT 13 05/12/2023   AST 20 05/12/2023   ALKPHOS 77 05/12/2023   BILITOT 0.5 05/12/2023   Lab Results  Component Value Date   HGBA1C 6.1 05/12/2023   HGBA1C 6.0 11/25/2022   HGBA1C 5.8 (H) 03/01/2022   HGBA1C 5.8 (H) 09/02/2021   HGBA1C 5.9 04/27/2021    Lab Results  Component Value Date   INSULIN  19.4 08/02/2023   INSULIN  16.1 03/01/2022   INSULIN  15.7 09/02/2021   Lab Results  Component Value Date   TSH 1.00 05/12/2023   Lab Results  Component Value Date   CHOL 187 05/12/2023   HDL 47.50 05/12/2023   LDLCALC 112 (H) 05/12/2023   TRIG 139.0 05/12/2023   CHOLHDL 4 05/12/2023   Lab Results  Component Value Date   WBC 3.0 (L) 05/12/2023   HGB 13.7 05/12/2023   HCT 41.2 05/12/2023   MCV 95.8 05/12/2023   PLT 239.0 05/12/2023   No results found for: "IRON", "TIBC", "FERRITIN"  Assessment and Plan:   Other Fatigue  [name] does feel that her weight is causing her energy to be lower than it should be. Fatigue may be related to obesity, depression or many other causes. Labs will be ordered, and in the meanwhile, [name] will focus on self care including making healthy food choices, increasing physical activity and focusing on stress reduction.  Shortness of Breath  [name] does feel that she gets out of breath more easily that she used to when she exercises. Geonna's shortness of breath appears to be obesity related and exercise induced. She has agreed to work on weight loss and gradually increase exercise to treat her exercise induced shortness of breath. Will continue to monitor closely.   Problem List Items Addressed This Visit       Cardiovascular and Mediastinum   Essential hypertension   Patient has had hypertension for a long time- can't recall when she diagnosed.  She is on losartan -hydrochlorothiazide  daily.  BP controlled today.  No chest pain, chest pressure or headache.  Continue current dose of medication at this time.        Endocrine   Hypothyroidism   Last TSH within normal limits but lower end of normal.  Repeat level in 6 months.      Relevant Orders   T4, free (Completed)   T3 (Completed)     Other   Class 3 severe obesity due to excess calories with body mass index (BMI) of 45.0 to 49.9 in  adult   Anthropometric Measurements Height: 5\' 4"  (1.626 m) Weight: 262 lb (118.8 kg) BMI (Calculated): 44.95 Starting Weight: 255 lb Total Weight Loss (lbs): 0 lb (0 kg) Body Composition  Body Fat %: 49.2 % Fat Mass (lbs): 129 lbs Muscle Mass (lbs): 126.4 lbs Total Body Water (lbs): 86.2 lbs Visceral Fat Rating : 17 Other Clinical Data RMR: 1685 Fasting: yes Labs: yes Today's Visit #: 1 Starting Date: 08/02/23 Comments: returning pt       Prediabetes   Recent A1c of 6.1 which has  gone up over the last two years.  She is not exercising the same as she did previously.  Insulin  level ordered today.      Relevant Orders   Insulin , random (Completed)   Vitamin D  deficiency   Last Vitamin D  level of 36.  On 2k international units daily.  Continue Vitamin D  OTC.  No nausea, vomiting, or muscle weakness.  Continue vitamin d  otc.      Other fatigue - Primary   Relevant Orders   EKG 12-Lead   Vitamin B12 (Completed)   Folate (Completed)   Other Visit Diagnoses       SOBOE (shortness of breath on exertion)         Depression screening         BMI 45.0-49.9, adult (HCC)         Morbid obesity (HCC)           Ayannah is currently in the action stage of change and her goal is to continue with weight loss efforts. I recommend Lylianna begin the structured treatment plan as follows:  She has agreed to Category 2 Plan and 300-400 calories and 30 or more grams of protein and 400-500 calories and 35 or more grams of protein for dinner.  Exercise goals: For substantial health benefits, adults should do at least 150 minutes (2 hours and 30 minutes) a week of moderate-intensity, or 75 minutes (1 hour and 15 minutes) a week of vigorous-intensity aerobic physical activity, or an equivalent combination of moderate- and vigorous-intensity aerobic activity. Aerobic activity should be performed in episodes of at least 10 minutes, and preferably, it should be spread throughout the  week.  Behavioral modification strategies:increasing lean protein intake, decreasing simple carbohydrates, increasing vegetables, meal planning and cooking strategies, and keeping healthy foods in the home  She was informed of the importance of frequent follow-up visits to maximize her success with intensive lifestyle modifications for her multiple health conditions. She was informed we would discuss her lab results at her next visit unless there is a critical issue that needs to be addressed sooner. Reeva agreed to keep her next visit at the agreed upon time to discuss these results.  Labs ordered with plans to discuss at the next visit.   Attestation Statements:  Reviewed by clinician on day of visit: allergies, medications, problem list, medical history, surgical history, family history, social history, and previous encounter notes.  This is the patient's first visit at [facility]. The patient's NEW PATIENT PACKET was reviewed at length. Included in the packet: current and past health history, medications, allergies, ROS, gynecologic history (women only), surgical history, family history, social history, weight history, weight loss surgery history (for those that have had weight loss surgery), nutritional evaluation, mood and food questionnaire, PHQ9, Epworth questionnaire, sleep habits questionnaire, patient life and health improvement goals questionnaire. These will all be scanned into the patient's chart under media.   During the visit, I independently reviewed the patient's EKG, bioimpedance scale results, and indirect calorimeter results. I used this information to tailor a meal plan for the patient that will help her to lose weight and will improve her obesity-related conditions going forward. I performed a medically necessary appropriate examination and/or evaluation. I discussed the assessment and treatment plan with the patient. The patient was provided an opportunity to  ask questions and all were answered. The patient agreed with the plan and demonstrated an understanding of the instructions. Labs were ordered at this visit and will  be reviewed at the next visit unless more critical results need to be addressed immediately. Clinical information was updated and documented in the EMR.     Donaciano Frizzle, MD

## 2023-08-02 NOTE — Telephone Encounter (Signed)
 Can we call radiology to see if we can get her pelvic/transvaginal ultrasound read soon?

## 2023-08-04 LAB — VITAMIN B12: Vitamin B-12: 306 pg/mL (ref 232–1245)

## 2023-08-04 LAB — T4, FREE: Free T4: 1.68 ng/dL (ref 0.82–1.77)

## 2023-08-04 LAB — INSULIN, RANDOM: INSULIN: 19.4 u[IU]/mL (ref 2.6–24.9)

## 2023-08-04 LAB — FOLATE: Folate: 6.4 ng/mL (ref >3.0)

## 2023-08-04 LAB — T3: T3, Total: 90 ng/dL (ref 71–180)

## 2023-08-13 NOTE — Assessment & Plan Note (Signed)
 Anthropometric Measurements Height: 5\' 4"  (1.626 m) Weight: 262 lb (118.8 kg) BMI (Calculated): 44.95 Starting Weight: 255 lb Total Weight Loss (lbs): 0 lb (0 kg) Body Composition  Body Fat %: 49.2 % Fat Mass (lbs): 129 lbs Muscle Mass (lbs): 126.4 lbs Total Body Water (lbs): 86.2 lbs Visceral Fat Rating : 17 Other Clinical Data RMR: 1685 Fasting: yes Labs: yes Today's Visit #: 1 Starting Date: 08/02/23 Comments: returning pt

## 2023-08-21 ENCOUNTER — Encounter (INDEPENDENT_AMBULATORY_CARE_PROVIDER_SITE_OTHER): Payer: Self-pay | Admitting: Family Medicine

## 2023-08-21 ENCOUNTER — Ambulatory Visit (INDEPENDENT_AMBULATORY_CARE_PROVIDER_SITE_OTHER): Admitting: Family Medicine

## 2023-08-21 VITALS — BP 109/76 | HR 65 | Temp 97.6°F | Ht 64.0 in | Wt 262.0 lb

## 2023-08-21 DIAGNOSIS — E7849 Other hyperlipidemia: Secondary | ICD-10-CM

## 2023-08-21 DIAGNOSIS — E559 Vitamin D deficiency, unspecified: Secondary | ICD-10-CM

## 2023-08-21 DIAGNOSIS — R7303 Prediabetes: Secondary | ICD-10-CM | POA: Diagnosis not present

## 2023-08-21 DIAGNOSIS — Z6841 Body Mass Index (BMI) 40.0 and over, adult: Secondary | ICD-10-CM

## 2023-08-21 NOTE — Assessment & Plan Note (Signed)
 The 10-year ASCVD risk score (Arnett DK, et al., 2019) is: 4.1%   Values used to calculate the score:     Age: 58 years     Sex: Female     Is Non-Hispanic African American: Yes     Diabetic: No     Tobacco smoker: No     Systolic Blood Pressure: 109 mmHg     Is BP treated: Yes     HDL Cholesterol: 47.5 mg/dL     Total Cholesterol: 187 mg/dL  No on cholesterol altering medication at this time.  Patient to work on limiting saturated fat intake to 20% or less of total intake daily.  Repeat labs in 3 months.

## 2023-08-21 NOTE — Assessment & Plan Note (Signed)
 Discussed importance of vitamin d  supplementation.  Vitamin d  supplementation has been shown to decrease fatigue, decrease risk of progression to insulin  resistance and then prediabetes, decreases risk of falling in older age and can even assist in decreasing depressive symptoms in PTSD.   Patient encouraged to increase OTC Vit D to 5k international units  daily.

## 2023-08-21 NOTE — Progress Notes (Signed)
 SUBJECTIVE:  Chief Complaint: Obesity  Interim History: Patient just returned back from a cruise- weather was nice, got to go into all the ports and enjoyed herself. Over the next few weeks she is planning to be home until the end of August.  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  She is doing some food prep for the week.  She is going to do some healthy choice meals over lunch.  She doesn't want to get bored with eating the same foods for lunch and dinner.  Wants to drink more water consistently. She got salmon for which she made salmon bites for the air fryer.  She also has done some mini potatoes.  Wants to buy some 100 calorie snack packs to eat in a controlled manner.   Vanessa Gardner is here to discuss her progress with her obesity treatment plan. She is on the Category 2 Plan and states she is following her eating plan approximately 40 % of the time. She states she is walking 8,500 steps while on the cruise.   OBJECTIVE: Visit Diagnoses: Problem List Items Addressed This Visit       Other   Prediabetes   Pathophysiology of progression through insulin  resistance to prediabetes and diabetes was discussed at length today.  Patient to continue to monitor and be in control of total intake of snack calories which may be simple carbohydrates but should be consumed only after the patient has taken in all the nutrition for the day.  Macronutrient identification, classification and daily intake ratios were discussed.  Plan to repeat labs in 3 months to monitor both hemoglobin A1c and insulin  levels.  No medications at this time as patient is not having significant hunger or cravings that would make following meal plan more difficult.         Vitamin D  deficiency - Primary   Discussed importance of vitamin d  supplementation.  Vitamin d  supplementation has been shown to decrease fatigue, decrease risk of progression to insulin  resistance and then prediabetes,  decreases risk of falling in older age and can even assist in decreasing depressive symptoms in PTSD.   Patient encouraged to increase OTC Vit D to 5k international units  daily.       Other hyperlipidemia   The 10-year ASCVD risk score (Arnett DK, et al., 2019) is: 4.1%   Values used to calculate the score:     Age: 71 years     Sex: Female     Is Non-Hispanic African American: Yes     Diabetic: No     Tobacco smoker: No     Systolic Blood Pressure: 109 mmHg     Is BP treated: Yes     HDL Cholesterol: 47.5 mg/dL     Total Cholesterol: 187 mg/dL  No on cholesterol altering medication at this time.  Patient to work on limiting saturated fat intake to 20% or less of total intake daily.  Repeat labs in 3 months.      Other Visit Diagnoses       Morbid obesity (HCC)         BMI 40.0-44.9, adult (HCC)           Vitals Temp: 97.6 F (36.4 C) BP: 109/76 Pulse Rate: 65 SpO2: 100 %   Anthropometric Measurements Height: 5\' 4"  (1.626 m) Weight: 262 lb (118.8 kg) BMI (Calculated): 44.95 Weight at Last Visit: 262 lb Weight Lost Since Last Visit: 0 Weight Gained  Since Last Visit: 0 Starting Weight: 255 lb Total Weight Loss (lbs): 0 lb (0 kg)   Body Composition  Body Fat %: 45.5 % Fat Mass (lbs): 119.4 lbs Muscle Mass (lbs): 135.8 lbs Total Body Water (lbs): 85.8 lbs Visceral Fat Rating : 16   Other Clinical Data Today's Visit #: 2 Starting Date: 08/02/23     ASSESSMENT AND PLAN:  Diet: Emelin is currently in the action stage of change. As such, her goal is to continue with weight loss efforts and has agreed to the Category 2 Plan.   Exercise:  For substantial health benefits, adults should do at least 150 minutes (2 hours and 30 minutes) a week of moderate-intensity, or 75 minutes (1 hour and 15 minutes) a week of vigorous-intensity aerobic physical activity, or an equivalent combination of moderate- and vigorous-intensity aerobic activity. Aerobic activity  should be performed in episodes of at least 10 minutes, and preferably, it should be spread throughout the week.  Behavior Modification:  We discussed the following Behavioral Modification Strategies today: increasing lean protein intake, decreasing simple carbohydrates, increasing vegetables, meal planning and cooking strategies, and keeping healthy foods in the home.    She was informed of the importance of frequent follow up visits to maximize her success with intensive lifestyle modifications for her multiple health conditions.  Attestation Statements:   Reviewed by clinician on day of visit: allergies, medications, problem list, medical history, surgical history, family history, social history, and previous encounter notes.     Donaciano Frizzle, MD

## 2023-08-21 NOTE — Assessment & Plan Note (Signed)

## 2023-08-29 ENCOUNTER — Other Ambulatory Visit: Payer: Self-pay | Admitting: Podiatry

## 2023-08-31 ENCOUNTER — Encounter: Admitting: Obstetrics and Gynecology

## 2023-09-04 ENCOUNTER — Ambulatory Visit
Admission: RE | Admit: 2023-09-04 | Discharge: 2023-09-04 | Disposition: A | Discharge status: Home / Self Care | Source: Ambulatory Visit | Attending: Primary Care | Admitting: Primary Care

## 2023-09-04 DIAGNOSIS — Z1231 Encounter for screening mammogram for malignant neoplasm of breast: Secondary | ICD-10-CM | POA: Insufficient documentation

## 2023-09-06 ENCOUNTER — Ambulatory Visit: Payer: Self-pay | Admitting: Primary Care

## 2023-09-12 ENCOUNTER — Ambulatory Visit: Admitting: Family Medicine

## 2023-09-12 ENCOUNTER — Encounter: Payer: Self-pay | Admitting: Family Medicine

## 2023-09-12 ENCOUNTER — Other Ambulatory Visit (HOSPITAL_COMMUNITY)
Admission: RE | Admit: 2023-09-12 | Discharge: 2023-09-12 | Disposition: A | Discharge status: Home / Self Care | Source: Ambulatory Visit | Attending: Family Medicine | Admitting: Family Medicine

## 2023-09-12 VITALS — BP 138/84 | HR 72 | Ht 64.0 in | Wt 272.2 lb

## 2023-09-12 DIAGNOSIS — Z1331 Encounter for screening for depression: Secondary | ICD-10-CM

## 2023-09-12 DIAGNOSIS — N858 Other specified noninflammatory disorders of uterus: Secondary | ICD-10-CM | POA: Diagnosis not present

## 2023-09-12 DIAGNOSIS — N950 Postmenopausal bleeding: Secondary | ICD-10-CM | POA: Insufficient documentation

## 2023-09-12 NOTE — Assessment & Plan Note (Signed)
 S/p EMB today Manage based on results. Reviewed risks of PMB with pt.

## 2023-09-12 NOTE — Progress Notes (Signed)
    Subjective:    Patient ID: Vanessa Gardner is a 58 y.o. female presenting with New Patient (Initial Visit)  on 09/12/2023  HPI: Had an episode of PMB in may. U/s done showed endoemtrial thickness of 10 mm. Needs sampling. No bleeding since.  Review of Systems  Constitutional:  Negative for chills and fever.  Respiratory:  Negative for shortness of breath.   Cardiovascular:  Negative for chest pain.  Gastrointestinal:  Negative for abdominal pain, nausea and vomiting.  Genitourinary:  Negative for dysuria.  Skin:  Negative for rash.      Objective:    BP 138/84   Pulse 72   Ht 5' 4 (1.626 m)   Wt 272 lb 3.2 oz (123.5 kg)   LMP 07/24/2017   BMI 46.72 kg/m  Physical Exam Exam conducted with a chaperone present.  Constitutional:      General: She is not in acute distress.    Appearance: She is well-developed.  HENT:     Head: Normocephalic and atraumatic.   Eyes:     General: No scleral icterus.   Cardiovascular:     Rate and Rhythm: Normal rate.  Pulmonary:     Effort: Pulmonary effort is normal.  Abdominal:     Palpations: Abdomen is soft.   Musculoskeletal:     Cervical back: Neck supple.   Skin:    General: Skin is warm and dry.   Neurological:     Mental Status: She is alert and oriented to person, place, and time.    Procedure: Patient given informed consent, signed copy in the chart, time out was performed. Appropriate time out taken. . The patient was placed in the lithotomy position and the cervix brought into view with sterile speculum.  Portio of cervix cleansed x 2 with betadine swabs.  A tenaculum was placed in the anterior lip of the cervix.  The uterus was sounded for depth of 8 cm. A pipelle was introduced to into the uterus, suction created,  and an endometrial sample was obtained. All equipment was removed and accounted for.  The patient tolerated the procedure well.        Assessment & Plan:   Problem List Items Addressed This  Visit       Unprioritized   Post-menopausal bleeding - Primary   S/p EMB today Manage based on results. Reviewed risks of PMB with pt.       Relevant Orders   Surgical pathology( [facility]/ [facility])    Return in about 4 weeks (around 10/10/2023).  Glenys GORMAN Birk, MD 09/12/2023 10:48 AM

## 2023-09-12 NOTE — Progress Notes (Signed)
 CC:  Post menopausal bleeding, May 2025  PCP advised Ultrasound- Had Pelvic 08/02/23   Heavy bleeding 5-6 days in may but has stopped  No bleeding since pain    5-6 years without period

## 2023-09-14 LAB — SURGICAL PATHOLOGY

## 2023-09-15 ENCOUNTER — Ambulatory Visit: Payer: Self-pay | Admitting: Family Medicine

## 2023-09-21 ENCOUNTER — Encounter (INDEPENDENT_AMBULATORY_CARE_PROVIDER_SITE_OTHER): Payer: Self-pay | Admitting: Family Medicine

## 2023-09-21 ENCOUNTER — Ambulatory Visit (INDEPENDENT_AMBULATORY_CARE_PROVIDER_SITE_OTHER): Admitting: Family Medicine

## 2023-09-21 VITALS — BP 132/69 | HR 65 | Temp 97.9°F | Ht 64.0 in | Wt 265.0 lb

## 2023-09-21 DIAGNOSIS — R7303 Prediabetes: Secondary | ICD-10-CM | POA: Diagnosis not present

## 2023-09-21 DIAGNOSIS — Z6841 Body Mass Index (BMI) 40.0 and over, adult: Secondary | ICD-10-CM | POA: Diagnosis not present

## 2023-09-21 NOTE — Progress Notes (Signed)
 SUBJECTIVE:  Chief Complaint: Obesity  Interim History: Since last appointment patient had a biopsy and was found to be negative.  The waiting and angst about the biopsy and results did impact what she is trying to do here.  She has been able to do boot camp and that has helped her boot camp.  She realizes she needs to prep more consistently.   Vanessa Gardner is here to discuss her progress with her obesity treatment plan. She is on the Category 2 Plan and states she is following her eating plan approximately 40 % of the time. She states she is exercising 60 minutes 4 times per week.   OBJECTIVE: Visit Diagnoses: Problem List Items Addressed This Visit       Other   Prediabetes - Primary   Other Visit Diagnoses       Morbid obesity (HCC)         BMI 40.0-44.9, adult (HCC)           No data recorded      09/21/2023    2:00 PM 09/12/2023   10:40 AM 08/21/2023   11:24 AM  Vitals with BMI  Height 5' 4 5' 4   Weight 265 lbs 272 lbs 3 oz   BMI 45.46 46.7   Systolic 132 138 890  Diastolic 69 84 76  Pulse 65 72        ASSESSMENT AND PLAN: Assessment & Plan Prediabetes Patient is working on being mindful of simple carbohydrate intake and focusing on protein intake.  Will continue to work on Dealer with focus on protein intake. Morbid obesity (HCC) Anthropometric Measurements Height: 5' 4 (1.626 m) Weight: 265 lb (120.2 kg) BMI (Calculated): 45.46 Weight at Last Visit: 262 lb Weight Lost Since Last Visit: 0 Weight Gained Since Last Visit: 3 Starting Weight: 0 Body Composition  Body Fat %: 45.2 % Fat Mass (lbs): 120.2 lbs Muscle Mass (lbs): 138.2 lbs Total Body Water (lbs): 89.4 lbs Visceral Fat Rating : 16 Other Clinical Data Today's Visit #: 3 Starting Date: 08/02/23 Comments: Cat 2  BMI 40.0-44.9, adult (HCC)    Diet: Annia is currently in the action stage of change. As such, her goal is to continue with weight loss efforts and has  agreed to keeping a food journal and adhering to recommended goals of 1250-1350 calories and 90 or more grams protein daily. Patient to start food log or journaling meal plan.  The initial goal will be to habitually log or journal for at least 4 days a week.  The expectation it that patient may not initially meet calorie or protein goals as the nturitional understanding of food intake is begun.  We discussed the 10:1 ratio when reading a food label.  Patient agrees to keep a food log either electronically or on paper and bring to the next appointment to be able to dissect and discuss it with provider.    Exercise:  For substantial health benefits, adults should do at least 150 minutes (2 hours and 30 minutes) a week of moderate-intensity, or 75 minutes (1 hour and 15 minutes) a week of vigorous-intensity aerobic physical activity, or an equivalent combination of moderate- and vigorous-intensity aerobic activity. Aerobic activity should be performed in episodes of at least 10 minutes, and preferably, it should be spread throughout the week.  Behavior Modification:  We discussed the following Behavioral Modification Strategies today: increasing lean protein intake, decreasing simple carbohydrates, increasing vegetables, planning for success, and keep a  strict food journal.   Return in about 4 weeks (around 10/19/2023).   She was informed of the importance of frequent follow up visits to maximize her success with intensive lifestyle modifications for her multiple health conditions.  Attestation Statements:   Reviewed by clinician on day of visit: allergies, medications, problem list, medical history, surgical history, family history, social history, and previous encounter notes.     Adelita Cho, MD

## 2023-09-27 ENCOUNTER — Encounter: Admitting: Obstetrics & Gynecology

## 2023-10-01 NOTE — Assessment & Plan Note (Signed)
 Patient is working on being mindful of simple carbohydrate intake and focusing on protein intake.  Will continue to work on Dealer with focus on protein intake.

## 2023-10-12 ENCOUNTER — Ambulatory Visit (INDEPENDENT_AMBULATORY_CARE_PROVIDER_SITE_OTHER): Admitting: Family Medicine

## 2023-10-16 ENCOUNTER — Encounter (INDEPENDENT_AMBULATORY_CARE_PROVIDER_SITE_OTHER): Payer: Self-pay | Admitting: Family Medicine

## 2023-10-16 ENCOUNTER — Ambulatory Visit (INDEPENDENT_AMBULATORY_CARE_PROVIDER_SITE_OTHER): Admitting: Family Medicine

## 2023-10-16 VITALS — BP 131/54 | HR 65 | Temp 97.8°F | Ht 64.0 in | Wt 263.0 lb

## 2023-10-16 DIAGNOSIS — R7303 Prediabetes: Secondary | ICD-10-CM | POA: Diagnosis not present

## 2023-10-16 DIAGNOSIS — Z6841 Body Mass Index (BMI) 40.0 and over, adult: Secondary | ICD-10-CM | POA: Diagnosis not present

## 2023-10-16 DIAGNOSIS — I10 Essential (primary) hypertension: Secondary | ICD-10-CM

## 2023-10-16 NOTE — Progress Notes (Unsigned)
 SUBJECTIVE:  Chief Complaint: Obesity  Interim History: Her new grandson was born and she voices that that week was busy and she has been spending time with her family.  Intake wise she has been trying to stay consistent with her food journaling.  She was not eating on her normal routine when she was eating at the hospital.  She has been consistent with her exercising.  She doesn't feel like she is as consistent as she would like to be on her food intake.  She is having longer breaks without her granddaughter.  Some days she isn't eating enough in terms of total calorie intake.  Vanessa Gardner is here to discuss her progress with her obesity treatment plan. She is on the keeping a food journal and adhering to recommended goals of 1250-1350 calories and 90 grams of protein and states she is following her eating plan approximately 50 % of the time. She states she is exercising 60 minutes 4 times per week.   OBJECTIVE: Visit Diagnoses: Problem List Items Addressed This Visit       Cardiovascular and Mediastinum   Essential hypertension     Other   Prediabetes - Primary   Other Visit Diagnoses       Morbid obesity (HCC)         BMI 45.0-49.9, adult (HCC)           Vitals Temp: 97.8 F (36.6 C) BP: (!) 131/54 Pulse Rate: 65 SpO2: 100 %   Anthropometric Measurements Height: 5' 4 (1.626 m) Weight: 263 lb (119.3 kg) BMI (Calculated): 45.12 Weight at Last Visit: 265 lb Weight Lost Since Last Visit: 2 Weight Gained Since Last Visit: 0 Starting Weight: 255 lb Total Weight Loss (lbs): 0 lb (0 kg)   Body Composition  Body Fat %: 45.9 % Fat Mass (lbs): 121 lbs Muscle Mass (lbs): 135.6 lbs Total Body Water (lbs): 86.8 lbs Visceral Fat Rating : 16   Other Clinical Data Fasting: no Labs: no Today's Visit #: 4 Starting Date: 08/02/23 Comments: 1250-1350/90     ASSESSMENT AND PLAN: Assessment & Plan Prediabetes Discussed last A1c of 6.1.  Patient is working on limiting  her simple carbohydrate intake daily.  She wants to really refocus on meal plan and keeping a food log.  Will re-evaluate meal plan compliance  at next appointment. Essential hypertension Blood pressure controlled today.  No dizziness, lightheadedness, chest pain, chest pressure or headache.  She needs no change in her current treatment.  Will follow up at next appointment. Morbid obesity (HCC)  BMI 45.0-49.9, adult (HCC)    Diet: Vanessa Gardner is currently in the action stage of change. As such, her goal is to continue with weight loss efforts and has agreed to keeping a food journal and adhering to recommended goals of 1250-1350 calories and 90 or more grams of protein daily.   Exercise:  For substantial health benefits, adults should do at least 150 minutes (2 hours and 30 minutes) a week of moderate-intensity, or 75 minutes (1 hour and 15 minutes) a week of vigorous-intensity aerobic physical activity, or an equivalent combination of moderate- and vigorous-intensity aerobic activity. Aerobic activity should be performed in episodes of at least 10 minutes, and preferably, it should be spread throughout the week.  Behavior Modification:  We discussed the following Behavioral Modification Strategies today: increasing lean protein intake, decreasing simple carbohydrates, increasing vegetables, and planning for success.   Return in about 3 weeks (around 11/06/2023).   She was informed of  the importance of frequent follow up visits to maximize her success with intensive lifestyle modifications for her multiple health conditions.  Attestation Statements:   Reviewed by clinician on day of visit: allergies, medications, problem list, medical history, surgical history, family history, social history, and previous encounter notes.     Adelita Cho, MD

## 2023-10-18 NOTE — Assessment & Plan Note (Signed)
 Blood pressure controlled today.  No dizziness, lightheadedness, chest pain, chest pressure or headache.  She needs no change in her current treatment.  Will follow up at next appointment.

## 2023-10-18 NOTE — Assessment & Plan Note (Signed)
 Discussed last A1c of 6.1.  Patient is working on limiting her simple carbohydrate intake daily.  She wants to really refocus on meal plan and keeping a food log.  Will re-evaluate meal plan compliance  at next appointment.

## 2023-10-24 ENCOUNTER — Other Ambulatory Visit: Payer: Self-pay | Admitting: Primary Care

## 2023-10-24 DIAGNOSIS — E038 Other specified hypothyroidism: Secondary | ICD-10-CM

## 2023-11-06 ENCOUNTER — Ambulatory Visit (INDEPENDENT_AMBULATORY_CARE_PROVIDER_SITE_OTHER): Admitting: Family Medicine

## 2023-11-06 ENCOUNTER — Encounter (INDEPENDENT_AMBULATORY_CARE_PROVIDER_SITE_OTHER): Payer: Self-pay | Admitting: Family Medicine

## 2023-11-06 VITALS — BP 122/74 | HR 80 | Temp 98.0°F | Ht 64.0 in | Wt 267.0 lb

## 2023-11-06 DIAGNOSIS — I10 Essential (primary) hypertension: Secondary | ICD-10-CM | POA: Diagnosis not present

## 2023-11-06 DIAGNOSIS — R7303 Prediabetes: Secondary | ICD-10-CM

## 2023-11-06 DIAGNOSIS — Z6841 Body Mass Index (BMI) 40.0 and over, adult: Secondary | ICD-10-CM

## 2023-11-06 NOTE — Progress Notes (Unsigned)
 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  She is now getting 1800 calories and aiming for 150 g of protein daily.  She doesn't eat enough food- getting in around 1000 calories on average.  She is feeling pulled in many different directions.  She doesn't feel heavier than she was previously.  She has an upcoming cruise for 7 days.  She is working on increasing her water intake.  Vanessa Gardner is here to discuss her progress with her obesity treatment plan. She is on the keeping a food journal and adhering to recommended goals of 1250-1350 calories and 90 grams of protein and states she is following her eating plan approximately 70 % of the time. She states she is exercising 60 minutes 3 times per week.   OBJECTIVE: Visit Diagnoses: Problem List Items Addressed This Visit       Cardiovascular and Mediastinum   Essential hypertension - Primary     Other   Prediabetes   Other Visit Diagnoses       Morbid obesity (HCC)         BMI 45.0-49.9, adult (HCC)           Vitals Temp: 98 F (36.7 C) BP: 122/74 Pulse Rate: 80 SpO2: 98 %   Anthropometric Measurements Height: 5' 4 (1.626 m) Weight: 267 lb (121.1 kg) BMI (Calculated): 45.81 Weight at Last Visit: 263 lb Weight Lost Since Last Visit: 0 Weight Gained Since Last Visit: 4 Starting Weight: 255 lb Total Weight Loss (lbs): 0 lb (0 kg)   Body Composition  Body Fat %: 46.7 % Fat Mass (lbs): 125 lbs Muscle Mass (lbs): 135.4 lbs Total Body Water (lbs): 91 lbs Visceral Fat Rating : 17   Other Clinical Data Today's Visit #: 5 Starting Date: 08/02/23 Comments: 1250-1350/90     ASSESSMENT AND PLAN: Assessment & Plan Essential hypertension Blood pressure well-controlled today.  No change in current treatment.  Continue current treatment of losartan  hydrochlorothiazide  at same dose. Prediabetes Once again discussed macro  tracking and nutrition goals with patient today.  She is working on increasing protein and total calorie intake while limiting simple carbohydrates.  Will reevaluate patient's food journal at next appointment with nutrition goals below Morbid obesity (HCC)  BMI 45.0-49.9, adult (HCC)    Diet: Vanessa Gardner is currently in the action stage of change. As such, her goal is to continue with weight loss efforts and has agreed to keeping a food journal and adhering to recommended goals of 1250-1350 calories and 90 or more grams of protein. Patient to start food log or journaling meal plan.  The initial goal will be to habitually log or journal for at least 4 days a week.  The expectation it that patient may not initially meet calorie or protein goals as the nutritional understanding of food intake is begun.  We discussed the 10:1 ratio when reading a food label.  Patient agrees to keep a food log either electronically or on paper and bring to the next appointment to be able to dissect and discuss it with provider.    Exercise:  For substantial health benefits, adults should do at least 150 minutes (2 hours and 30 minutes) a week of moderate-intensity, or 75 minutes (1 hour and 15 minutes) a week of vigorous-intensity aerobic physical activity, or an equivalent combination of moderate- and vigorous-intensity aerobic activity. Aerobic activity should be performed in episodes of  at least 10 minutes, and preferably, it should be spread throughout the week.  Behavior Modification:  We discussed the following Behavioral Modification Strategies today: increasing lean protein intake, decreasing simple carbohydrates, increasing vegetables, meal planning and cooking strategies, and keeping healthy foods in the home.   Return in about 4 weeks (around 12/04/2023) for fasting labs.   She was informed of the importance of frequent follow up visits to maximize her success with intensive lifestyle modifications for her  multiple health conditions.  Attestation Statements:   Reviewed by clinician on day of visit: allergies, medications, problem list, medical history, surgical history, family history, social history, and previous encounter notes.     Vanessa Cho, MD

## 2023-11-07 NOTE — Assessment & Plan Note (Signed)
 Blood pressure well-controlled today.  No change in current treatment.  Continue current treatment of losartan  hydrochlorothiazide  at same dose.

## 2023-11-07 NOTE — Assessment & Plan Note (Signed)
 Once again discussed macro tracking and nutrition goals with patient today.  She is working on increasing protein and total calorie intake while limiting simple carbohydrates.  Will reevaluate patient's food journal at next appointment with nutrition goals below

## 2023-11-27 ENCOUNTER — Encounter (INDEPENDENT_AMBULATORY_CARE_PROVIDER_SITE_OTHER): Payer: Self-pay | Admitting: Family Medicine

## 2023-11-27 ENCOUNTER — Ambulatory Visit (INDEPENDENT_AMBULATORY_CARE_PROVIDER_SITE_OTHER): Admitting: Family Medicine

## 2023-11-27 VITALS — BP 128/71 | HR 69 | Temp 97.5°F | Ht 64.0 in | Wt 264.0 lb

## 2023-11-27 DIAGNOSIS — I10 Essential (primary) hypertension: Secondary | ICD-10-CM

## 2023-11-27 DIAGNOSIS — E7849 Other hyperlipidemia: Secondary | ICD-10-CM | POA: Diagnosis not present

## 2023-11-27 DIAGNOSIS — E038 Other specified hypothyroidism: Secondary | ICD-10-CM

## 2023-11-27 DIAGNOSIS — E559 Vitamin D deficiency, unspecified: Secondary | ICD-10-CM

## 2023-11-27 DIAGNOSIS — R7303 Prediabetes: Secondary | ICD-10-CM | POA: Diagnosis not present

## 2023-11-27 DIAGNOSIS — Z6841 Body Mass Index (BMI) 40.0 and over, adult: Secondary | ICD-10-CM

## 2023-11-27 NOTE — Progress Notes (Unsigned)
 SUBJECTIVE:  Chief Complaint: Obesity  Interim History: Patient went on a cruise since her last appointment and voices that she didn't have boot camp last week because it was the week they were on break.  She has been logging food on her weight watchers app.  Last week she logged a total of 6 days.  When she is logging she is getting between 1000-1100 calories and normally around 73-75g of protein daily.  She found the owyn protein shakes and she is trying to drink at least one a day. She is not hungry and is having to push herself to get her total calories in. Her cravings are still there but it is not hunger.  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 She denies any significant trips, events or activities that would be an obstacle for her.  Vanessa Gardner is here to discuss her progress with her obesity treatment plan. She is on the keeping a food journal and adhering to recommended goals of 1250-1350 calories and 90 grams of protein and states she is following her eating plan approximately 80 % of the time. She states she is walking and [facility] for 60 minutes 4 times per week.   OBJECTIVE: Visit Diagnoses: Problem List Items Addressed This Visit       Cardiovascular and Mediastinum   Essential hypertension - Primary   Relevant Orders   Comprehensive metabolic panel with GFR     Other   Prediabetes   Relevant Orders   Hemoglobin A1c   Insulin , random   Vitamin D  deficiency   Relevant Orders   VITAMIN D  25 Hydroxy (Vit-D Deficiency, Fractures)   Other hyperlipidemia   Relevant Orders   Lipid Panel With LDL/HDL Ratio   Other Visit Diagnoses       Morbid obesity (HCC)         BMI 45.0-49.9, adult (HCC)           Vitals Temp: (!) 97.5 F (36.4 C) BP: 128/71 Pulse Rate: 69 SpO2: 99 %   Anthropometric Measurements Height: 5' 4 (1.626 m) Weight: 264 lb (119.7 kg) BMI (Calculated): 45.29 Weight at Last Visit: 267 lb Weight Lost Since Last  Visit: 3 Weight Gained Since Last Visit: 0 Starting Weight: 255 lb Total Weight Loss (lbs): 0 lb (0 kg)   Body Composition  Body Fat %: 45.7 % Fat Mass (lbs): 120.8 lbs Muscle Mass (lbs): 1369.4 lbs Total Body Water (lbs): 88 lbs Visceral Fat Rating : 16   Other Clinical Data Fasting: yes Labs: yes Today's Visit #: 6 Starting Date: 08/02/23 Comments: 1250-1350/90     ASSESSMENT AND PLAN: Assessment & Plan Essential hypertension Patient's blood pressure is controlled today.  She denies chest pain, chest pressure, or headache.  Stable on combination medication of Hyzaar 50-12.5 mg.  No change in treatment or medication at this time.  Continue to monitor blood pressure at subsequent appointments. Prediabetes Previously A1c elevated over 6.  Patient has been modifying nutritional intake and focusing on increasing her dietary protein while limiting her simple carbohydrate and saturated fat intake.  She is fasting for labs today.  Will repeat CMP, A1c, and insulin  level today.  Plan to discuss labs at next appointment.  Patient is not currently on pharmacotherapy. Other hyperlipidemia Previously elevated LDL with HDL and triglycerides at goal.  10-year ASCVD risk done previously.  Repeat fasting lipid panel today and repeat ASCVD risk at next appointment with lab results from  today's appointment. Vitamin D  deficiency Vitamin D  level not at goal at last lab draw.  Repeat vitamin D  level today to assess response to supplementation.  Patient to continue over-the-counter vitamin D  supplementation until next appointment when we discuss if any alterations in dosages is needed. Morbid obesity (HCC)  BMI 45.0-49.9, adult (HCC)    Diet: Vanessa Gardner is currently in the action stage of change. As such, her goal is to continue with weight loss efforts and has agreed to keeping a food journal and adhering to recommended goals of 1250-1350 calories and 85 or more grams of protein daily. Patient to  start food log or journaling meal plan.  The initial goal will be to habitually log or journal for at least 4 days a week.  The expectation it that patient may not initially meet calorie or protein goals as the nutritional understanding of food intake is begun.  We discussed the 10:1 ratio when reading a food label.  Patient agrees to keep a food log either electronically or on paper and bring to the next appointment to be able to dissect and discuss it with provider.    Exercise:  For substantial health benefits, adults should do at least 150 minutes (2 hours and 30 minutes) a week of moderate-intensity, or 75 minutes (1 hour and 15 minutes) a week of vigorous-intensity aerobic physical activity, or an equivalent combination of moderate- and vigorous-intensity aerobic activity. Aerobic activity should be performed in episodes of at least 10 minutes, and preferably, it should be spread throughout the week.  Behavior Modification:  We discussed the following Behavioral Modification Strategies today: increasing lean protein intake, decreasing simple carbohydrates, increasing vegetables, meal planning and cooking strategies, planning for success, and keep a strict food journal. We discussed various medication options to help Vanessa Gardner with her weight loss efforts and we both agreed to further discuss medication options at next appointment.  Return in about 3 weeks (around 12/18/2023).   She was informed of the importance of frequent follow up visits to maximize her success with intensive lifestyle modifications for her multiple health conditions.  Attestation Statements:   Reviewed by clinician on day of visit: allergies, medications, problem list, medical history, surgical history, family history, social history, and previous encounter notes.     Vanessa Cho, MD

## 2023-11-28 LAB — COMPREHENSIVE METABOLIC PANEL WITH GFR
ALT: 17 IU/L (ref 0–32)
AST: 24 IU/L (ref 0–40)
Albumin: 4.2 g/dL (ref 3.8–4.9)
Alkaline Phosphatase: 95 IU/L (ref 44–121)
BUN/Creatinine Ratio: 14 (ref 9–23)
BUN: 12 mg/dL (ref 6–24)
Bilirubin Total: 0.4 mg/dL (ref 0.0–1.2)
CO2: 25 mmol/L (ref 20–29)
Calcium: 9.4 mg/dL (ref 8.7–10.2)
Chloride: 100 mmol/L (ref 96–106)
Creatinine, Ser: 0.88 mg/dL (ref 0.57–1.00)
Globulin, Total: 2.8 g/dL (ref 1.5–4.5)
Glucose: 96 mg/dL (ref 70–99)
Potassium: 4 mmol/L (ref 3.5–5.2)
Sodium: 139 mmol/L (ref 134–144)
Total Protein: 7 g/dL (ref 6.0–8.5)
eGFR: 76 mL/min/1.73 (ref >59)

## 2023-11-28 LAB — LIPID PANEL WITH LDL/HDL RATIO
Cholesterol, Total: 211 mg/dL — ABNORMAL HIGH (ref 100–199)
HDL: 54 mg/dL (ref >39)
LDL Chol Calc (NIH): 142 mg/dL — ABNORMAL HIGH (ref 0–99)
LDL/HDL Ratio: 2.6 ratio (ref 0.0–3.2)
Triglycerides: 85 mg/dL (ref 0–149)
VLDL Cholesterol Cal: 15 mg/dL (ref 5–40)

## 2023-11-28 LAB — HEMOGLOBIN A1C
Est. average glucose Bld gHb Est-mCnc: 117 mg/dL
Hgb A1c MFr Bld: 5.7 % — ABNORMAL HIGH (ref 4.8–5.6)

## 2023-11-28 LAB — INSULIN, RANDOM: INSULIN: 27.5 u[IU]/mL — ABNORMAL HIGH (ref 2.6–24.9)

## 2023-11-28 LAB — VITAMIN D 25 HYDROXY (VIT D DEFICIENCY, FRACTURES): Vit D, 25-Hydroxy: 61.2 ng/mL (ref 30.0–100.0)

## 2023-11-28 LAB — TSH: TSH: 1.75 u[IU]/mL (ref 0.450–4.500)

## 2023-11-28 NOTE — Assessment & Plan Note (Signed)
 Previously A1c elevated over 6.  Patient has been modifying nutritional intake and focusing on increasing her dietary protein while limiting her simple carbohydrate and saturated fat intake.  She is fasting for labs today.  Will repeat CMP, A1c, and insulin  level today.  Plan to discuss labs at next appointment.  Patient is not currently on pharmacotherapy.

## 2023-11-28 NOTE — Assessment & Plan Note (Signed)
 Vitamin D  level not at goal at last lab draw.  Repeat vitamin D  level today to assess response to supplementation.  Patient to continue over-the-counter vitamin D  supplementation until next appointment when we discuss if any alterations in dosages is needed.

## 2023-11-28 NOTE — Assessment & Plan Note (Signed)
 Patient's blood pressure is controlled today.  She denies chest pain, chest pressure, or headache.  Stable on combination medication of Hyzaar 50-12.5 mg.  No change in treatment or medication at this time.  Continue to monitor blood pressure at subsequent appointments.

## 2023-11-28 NOTE — Assessment & Plan Note (Signed)
 Previously elevated LDL with HDL and triglycerides at goal.  10-year ASCVD risk done previously.  Repeat fasting lipid panel today and repeat ASCVD risk at next appointment with lab results from today's appointment.

## 2023-12-18 ENCOUNTER — Ambulatory Visit: Payer: Self-pay

## 2023-12-18 NOTE — Telephone Encounter (Signed)
 FYI Only or Action Required?: FYI only for provider.  Patient was last seen in primary care on 11/27/2023 by [place] Adelita PENNER, MD.  Called Nurse Triage reporting Diarrhea.  Symptoms began several months ago.  Interventions attempted: Rest, hydration, or home remedies.  Symptoms are: states the s/s are intermittent occurred initially once per month and fully resolved on its own. Pt states at this time it is occurring more frequently, sometimes multiple times per week.  Triage Disposition: See PCP When Office is Open (Within 3 Days)  Patient/caregiver understands and will follow disposition?: Yes, will follow disposition  Message from [place] J sent at 12/18/2023  9:00 AM EDT  pt calling to schedule an apt. tos discuss  iritable bow syndroms, no bleeding, cramping, abdominal cramping, no pain, not everyday but friday until today she has used the restroom or 3 or 4 times. stool is  a mixture of  loose an hard. No pain just want to discuss symptoms.   Reason for Disposition  [1] MILD diarrhea (e.g., 1-3 or more stools than normal in past 24 hours) AND [2] present >  7 days  (Exception: Chronic diarrhea that is not worse.)  Answer Assessment - Initial Assessment Questions 1. DIARRHEA SEVERITY: How bad is the diarrhea? How many more stools have you had in the past 24 hours than normal?      3 2. ONSET: When did the diarrhea begin?      Been going on for a bit, states about a year ago she started having episodes every month where she would have hard stool in the morning, which progresses to diarrhea by the end of the day. Pt states the episodes have been more prevalent, states that it is occurring multiple times per week at this point.  3. STOOL DESCRIPTION:  How loose or watery is the diarrhea? What is the stool color? Is there any blood or mucous in the stool?     loose 4. VOMITING: Are you also vomiting? If Yes, ask: How many times in the past 24 hours?      denies 5.  ABDOMEN PAIN: Are you having any abdomen pain? If Yes, ask: What does it feel like? (e.g., crampy, dull, intermittent, constant)      denies 6. ABDOMEN PAIN SEVERITY: If present, ask: How bad is the pain?  (e.g., Scale 1-10; mild, moderate, or severe)     Sometimes it occurs sometimes it does not.  9. EXPOSURE: Have you traveled to a foreign country recently? Have you been exposed to anyone with diarrhea? Could you have eaten any food that was spoiled?     Pt states that both her and her spouse were both sick yesterday with the same s/s. 10. ANTIBIOTIC USE: Are you taking antibiotics now or have you taken antibiotics in the past 2 months?       denies 11. OTHER SYMPTOMS: Do you have any other symptoms? (e.g., fever, blood in stool)       denies  Protocols used: Diarrhea-A-AH

## 2023-12-18 NOTE — Telephone Encounter (Unsigned)
 Copied from CRM #[id number]. Topic: Clinical - Pink Word Triage >> Dec 18, 2023  8:59 AM Frederich PARAS wrote: Reason for Triage: pt calling to schedule an apt. tos discuss  iritable bow syndroms, no bleeding, cramping, abdominal cramping, no pain, not everyday but friday until today she has used the restroom or 3 or 4 times. stool is  a mixture of  loose an hard. No pain just want to discuss symptoms. >> Dec 18, 2023  9:00 AM Frederich PARAS wrote: pt calling to schedule an apt. tos discuss  iritable bow syndroms, no bleeding, cramping, abdominal cramping, no pain, not everyday but friday until today she has used the restroom or 3 or 4 times. stool is  a mixture of  loose an hard. No pain just want to discuss symptoms.

## 2023-12-18 NOTE — Telephone Encounter (Signed)
 Will see patient then Agree with ER and UC precautions

## 2023-12-20 ENCOUNTER — Ambulatory Visit: Admitting: Family Medicine

## 2023-12-20 ENCOUNTER — Encounter: Payer: Self-pay | Admitting: Family Medicine

## 2023-12-20 VITALS — BP 119/70 | HR 66 | Temp 98.5°F | Ht 64.0 in | Wt 266.4 lb

## 2023-12-20 DIAGNOSIS — Z23 Encounter for immunization: Secondary | ICD-10-CM

## 2023-12-20 DIAGNOSIS — R195 Other fecal abnormalities: Secondary | ICD-10-CM | POA: Insufficient documentation

## 2023-12-20 NOTE — Patient Instructions (Addendum)
 Hold the collagen for a week just to see if the diarrhea improves   Watch for pain or blood or fever or other changes   Drink lots of water  Get benefiber over the counter - mix with fluid and take it once daily to see if helpful (If no improvement in 1-2 weeks stop it)   Eat a [place] fiber diet also   Keep a diet journal to see if any triggers  Dairy  Wheat (gluten)  Spicy food  Fatty food / fried food  Red meat    If you have a bad day - try immodium as needed (maxiumum 2 doses per day)   If worse or not improving -follow up

## 2023-12-20 NOTE — Assessment & Plan Note (Signed)
 Intermittent loose stools -more often than in the past  With urgency but no pain or blood Reviewed colonoscopy rep - 2019 totally normal Normal GI history Reassuring exam  No major GI family history  TSH is in normal range   Strongly suspect IBS Instructed to hold collagen for a week to see if it may be side effects Keep up fluids/fiber Trial of benefiber daily Food journal to watch for triggers   Handout given Further eval recommended if worse or not improving  Call back and Er precautions noted in detail today

## 2023-12-20 NOTE — Progress Notes (Signed)
 Subjective:    Patient ID: Vanessa Gardner, female    DOB: 24-Aug-1965, 58 y.o.   MRN: [id number]  HPI  Wt Readings from Last 3 Encounters:  12/20/23 266 lb 6 oz (120.8 kg)  11/27/23 264 lb (119.7 kg)  11/06/23 267 lb (121.1 kg)   45.72 kg/m  Vitals:   12/20/23 1146 12/20/23 1208  BP: (!) 140/68 119/70  Pulse: 66   Temp: 98.5 F (36.9 C)   SpO2: 98%      58 yo pf of [facility] presents with  Intermittent diarrhea   For years has occasional days of loose and urgent stools  Lately more often  At least 2 times per week   Up to 6 bms per day  As little as one   No blood in stool  No abd pain  No severe cramping No weight change    Baseline bm once daily before this     Taking more vitamin D  lately  Taking a collagen powder   Trying to get 90 g of protein daily ([facility])   Produce consumption is about the same   Uses weight watchers app  Stress has not changed      Possible IBS   Has graves dz  Last tsh is normal    Normal colonoscopy 07/2017   Lab Results  Component Value Date   WBC 3.0 (L) 05/12/2023   HGB 13.7 05/12/2023   HCT 41.2 05/12/2023   MCV 95.8 05/12/2023   PLT 239.0 05/12/2023   Baseline neutropenia   Lab Results  Component Value Date   NA 139 11/27/2023   K 4.0 11/27/2023   CO2 25 11/27/2023   GLUCOSE 96 11/27/2023   BUN 12 11/27/2023   CREATININE 0.88 11/27/2023   CALCIUM 9.4 11/27/2023   GFR 75.29 05/12/2023   EGFR 76 11/27/2023   Lab Results  Component Value Date   TSH 1.750 11/27/2023   Lab Results  Component Value Date   ALT 17 11/27/2023   AST 24 11/27/2023   ALKPHOS 95 11/27/2023   BILITOT 0.4 11/27/2023      Patient Active Problem List   Diagnosis Date Noted   Loose stools 12/20/2023   Post-menopausal bleeding 07/31/2023   Neutropenia, unspecified 05/22/2023   Chronic foot pain, left 11/25/2022   Other fatigue 11/25/2022   Vitamin D  deficiency 03/13/2022    Other hyperlipidemia 03/13/2022   Essential hypertension 04/23/2020   GAD (generalized anxiety disorder) 07/31/2019   Screening for cervical cancer 05/14/2018   Encounter for annual general medical examination with abnormal findings in adult 10/01/2015   Prediabetes 10/01/2015   Hypothyroidism 08/24/2015   Class 3 severe obesity due to excess calories with body mass index (BMI) of 45.0 to 49.9 in adult [facility]) 10/19/2006   GERD 10/19/2006   Past Medical History:  Diagnosis Date   Allergy    Anxiety 2021   Back pain    Chicken pox    Essential hypertension    GERD (gastroesophageal reflux disease) 2013   Graves disease    Rodenbeck cholesterol    Hypothyroidism    Joint pain    Prediabetes    Vitamin D  deficiency    Past Surgical History:  Procedure Laterality Date   CESAREAN SECTION  02/26/1989   DILATION AND CURETTAGE OF UTERUS     EYE SURGERY Right 2013   TUBAL LIGATION  1998   Social History   Tobacco Use   Smoking  status: Never   Smokeless tobacco: Never  Vaping Use   Vaping status: Never Used  Substance Use Topics   Alcohol use: Yes    Comment: twice a month-wine   Drug use: No   Family History  Problem Relation Age of Onset   Hypertension Mother    Kidney disease Mother    Hyperlipidemia Father    Alcohol abuse Father    Hypertension Father    Cirrhosis Father    Tensley Cholesterol Father    Heart disease Father    Obesity Father    Alcoholism Father    Pulmonary fibrosis Sister    Throat cancer Maternal Grandfather    Breast cancer Neg Hx    Colon cancer Neg Hx    Stomach cancer Neg Hx    Esophageal cancer Neg Hx    Allergies  Allergen Reactions   Sulfa Antibiotics Anaphylaxis   Cefuroxime Axetil     REACTION: swelling   Norvasc [Amlodipine Besylate] Swelling   Sulfamethoxazole-Trimethoprim     Other Reaction(s): Unknown   Current Outpatient Medications on File Prior to Visit  Medication Sig Dispense Refill   Cholecalciferol (VITAMIN D3) 125 MCG  (5000 UT) CAPS Take 5,000 Units by mouth daily at 12 noon.     levothyroxine  (SYNTHROID ) 112 MCG tablet TAKE 1 TABLET BY MOUTH EVERY MORNING ON AN EMPTY STOMACH WITH WATER ONLY. NO FOOD OR OTHER MEDICATIONS FOR 30 MINUTES. 90 tablet 1   loratadine (CLARITIN) 10 MG tablet Take 10 mg by mouth daily.     losartan -hydrochlorothiazide  (HYZAAR) 50-12.5 MG tablet TAKE 1 TABLET BY MOUTH EVERY DAY FOR BLOOD PRESSURE 90 tablet 2   No current facility-administered medications on file prior to visit.    Review of Systems  Constitutional:  Negative for activity change, appetite change, chills, diaphoresis, fatigue and fever.  HENT:  Negative for sore throat.   Respiratory:  Negative for cough.   Gastrointestinal:  Positive for diarrhea. Negative for abdominal distention, abdominal pain, anal bleeding, blood in stool, constipation, nausea, rectal pain and vomiting.       Objective:   Physical Exam Constitutional:      General: She is not in acute distress.    Appearance: Normal appearance. She is well-developed. She is obese. She is not ill-appearing or diaphoretic.  HENT:     Head: Normocephalic and atraumatic.  Eyes:     General: No scleral icterus.    Conjunctiva/sclera: Conjunctivae normal.     Pupils: Pupils are equal, round, and reactive to light.  Cardiovascular:     Rate and Rhythm: Normal rate and regular rhythm.     Heart sounds: Normal heart sounds.  Pulmonary:     Effort: Pulmonary effort is normal. No respiratory distress.     Breath sounds: Normal breath sounds. No wheezing or rales.  Abdominal:     General: Abdomen is protuberant. Bowel sounds are normal. There is no distension or abdominal bruit. There are no signs of injury.     Palpations: Abdomen is soft. There is no shifting dullness, hepatomegaly, splenomegaly, mass or pulsatile mass.     Tenderness: There is no abdominal tenderness. There is no guarding or rebound. Negative signs include Murphy's sign and McBurney's sign.   Musculoskeletal:     Cervical back: Normal range of motion and neck supple.  Lymphadenopathy:     Cervical: No cervical adenopathy.  Skin:    General: Skin is warm and dry.     Coloration: Skin is not pale.  Findings: No erythema.  Neurological:     Mental Status: She is alert.           Assessment & Plan:   Problem List Items Addressed This Visit       Other   Loose stools - Primary   Intermittent loose stools -more often than in the past  With urgency but no pain or blood Reviewed colonoscopy rep - 2019 totally normal Normal GI history Reassuring exam  No major GI family history  TSH is in normal range   Strongly suspect IBS Instructed to hold collagen for a week to see if it may be side effects Keep up fluids/fiber Trial of benefiber daily Food journal to watch for triggers   Handout given Further eval recommended if worse or not improving  Call back and Er precautions noted in detail today           Other Visit Diagnoses       Need for influenza vaccination       Relevant Orders   Flu vaccine trivalent PF, 6mos and older(Flulaval,Afluria,Fluarix,Fluzone) (Completed)

## 2023-12-20 NOTE — Addendum Note (Signed)
 Addended by: RANDEEN HARDY A on: 12/20/2023 01:50 PM   Modules accepted: Level of Service

## 2024-01-04 ENCOUNTER — Ambulatory Visit (INDEPENDENT_AMBULATORY_CARE_PROVIDER_SITE_OTHER): Admitting: Family Medicine

## 2024-01-17 ENCOUNTER — Telehealth (INDEPENDENT_AMBULATORY_CARE_PROVIDER_SITE_OTHER): Admitting: Family Medicine

## 2024-01-17 VITALS — Ht 64.0 in

## 2024-01-17 DIAGNOSIS — Z6841 Body Mass Index (BMI) 40.0 and over, adult: Secondary | ICD-10-CM | POA: Diagnosis not present

## 2024-01-17 DIAGNOSIS — E7849 Other hyperlipidemia: Secondary | ICD-10-CM | POA: Diagnosis not present

## 2024-01-17 NOTE — Progress Notes (Addendum)
 TeleHealth Visit:   Vanessa Gardner has verbally consented to this TeleHealth visit. The patient is located at home, the provider is located at the [facility] office. The participants in this visit include the listed provider and patient . The visit was conducted today via Forensic Scientist Complaint: OBESITY Vanessa Gardner is here to discuss her progress with her obesity treatment plan along with follow-up of her obesity related diagnoses. Vanessa Gardner is on the Category 2 Plan and states she is following her eating plan approximately 65% of the time. Vanessa Gardner states she is excise 60 minutes 7 times per week.  No data recorded No data recorded No data recorded No data recorded  Reported Weight:264.1  Subjective:  Since last appointment patient has restarted going to Weight Watchers and realizes she needs the weekly accountability.  She voices she felt like at the end of the last week she was less mindful due to homecoming.  She has noticed the days she eats more of the zero point foods are the days she is able to hit her protein goal and stay within her calories.  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  Over the next few weeks she has one activity this next Saturday evening.  She wants to start getting ready for Thanksgiving and getting the house clean. There are no diagnoses linked to this encounter. Assessment/Plan:   Assessment & Plan Other hyperlipidemia Patient has been working on modifying her dietary intake to limit her saturated fats.  Ultimate goal will be less than 20% of total daily intake.  Follow-up at next appointment as to dietary plan patient is following as she is working with [employer] additionally. Morbid obesity (HCC)  BMI 45.0-49.9, adult (HCC)    There are no  diagnoses linked to this encounter.  Vanessa Gardner is currently in the action stage of change. As such, her goal is to continue with weight loss efforts and has agreed to keeping a food journal and adhering to recommended goals of 1250-1350 calories and 85 or more grams of protein.   Exercise goals: For substantial health benefits, adults should do at least 150 minutes (2 hours and 30 minutes) a week of moderate-intensity, or 75 minutes (1 hour and 15 minutes) a week of vigorous-intensity aerobic physical activity, or an equivalent combination of moderate- and vigorous-intensity aerobic activity. Aerobic activity should be performed in episodes of at least 10 minutes, and preferably, it should be spread throughout the week.  Behavioral modification strategies: increasing lean protein intake, decreasing simple carbohydrates, increasing vegetables, holiday eating strategies , and planning for success.  Lacy has agreed to follow-up with our clinic in 4 weeks. She was informed of the importance of frequent follow-up visits to maximize her success with intensive lifestyle modifications for her multiple health conditions.   Objective:   VITALS: Per patient if applicable, see vitals. GENERAL: Alert and in no acute distress. CARDIOPULMONARY: No increased WOB. Speaking in clear sentences.  PSYCH: Pleasant and cooperative. Speech normal rate and rhythm. Affect is appropriate. Insight and judgement are appropriate. Attention is focused, linear, and appropriate.  NEURO: Oriented as arrived to appointment on time with no prompting.   Lab Results  Component Value Date   CREATININE 0.88 11/27/2023   BUN 12 11/27/2023  NA 139 11/27/2023   K 4.0 11/27/2023   CL 100 11/27/2023   CO2 25 11/27/2023   Lab Results  Component Value Date   ALT 17 11/27/2023   AST 24 11/27/2023   ALKPHOS 95 11/27/2023   BILITOT 0.4 11/27/2023   Lab Results  Component Value Date   HGBA1C 5.7 (H) 11/27/2023   HGBA1C 6.1  05/12/2023   HGBA1C 6.0 11/25/2022   HGBA1C 5.8 (H) 03/01/2022   HGBA1C 5.8 (H) 09/02/2021   Lab Results  Component Value Date   INSULIN  27.5 (H) 11/27/2023   INSULIN  19.4 08/02/2023   INSULIN  16.1 03/01/2022   INSULIN  15.7 09/02/2021   Lab Results  Component Value Date   TSH 1.750 11/27/2023   Lab Results  Component Value Date   CHOL 211 (H) 11/27/2023   HDL 54 11/27/2023   LDLCALC 142 (H) 11/27/2023   TRIG 85 11/27/2023   CHOLHDL 4 05/12/2023   Lab Results  Component Value Date   VD25OH 61.2 11/27/2023   VD25OH 36.87 05/12/2023   VD25OH 40.6 03/01/2022   Lab Results  Component Value Date   WBC 3.0 (L) 05/12/2023   HGB 13.7 05/12/2023   HCT 41.2 05/12/2023   MCV 95.8 05/12/2023   PLT 239.0 05/12/2023   No results found for: IRON, TIBC, FERRITIN  Attestation Statements:   Reviewed by clinician on day of visit: allergies, medications, problem list, medical history, surgical history, family history, social history, and previous encounter notes.    Adelita Cho, MD

## 2024-01-21 NOTE — Assessment & Plan Note (Signed)
 Patient has been working on modifying her dietary intake to limit her saturated fats.  Ultimate goal will be less than 20% of total daily intake.  Follow-up at next appointment as to dietary plan patient is following as she is working with [employer] additionally.

## 2024-03-04 ENCOUNTER — Ambulatory Visit (INDEPENDENT_AMBULATORY_CARE_PROVIDER_SITE_OTHER): Admitting: Family Medicine

## 2024-03-12 ENCOUNTER — Ambulatory Visit (INDEPENDENT_AMBULATORY_CARE_PROVIDER_SITE_OTHER): Admitting: Family Medicine

## 2024-03-12 ENCOUNTER — Encounter (INDEPENDENT_AMBULATORY_CARE_PROVIDER_SITE_OTHER): Payer: Self-pay | Admitting: Family Medicine

## 2024-03-12 VITALS — BP 136/80 | HR 62 | Temp 97.7°F | Ht 64.0 in | Wt 260.0 lb

## 2024-03-12 DIAGNOSIS — F3289 Other specified depressive episodes: Secondary | ICD-10-CM

## 2024-03-12 DIAGNOSIS — F5089 Other specified eating disorder: Secondary | ICD-10-CM | POA: Diagnosis not present

## 2024-03-12 DIAGNOSIS — Z6841 Body Mass Index (BMI) 40.0 and over, adult: Secondary | ICD-10-CM

## 2024-03-12 DIAGNOSIS — R7303 Prediabetes: Secondary | ICD-10-CM | POA: Diagnosis not present

## 2024-03-12 DIAGNOSIS — F339 Major depressive disorder, recurrent, unspecified: Secondary | ICD-10-CM

## 2024-03-12 MED ORDER — BUPROPION HCL ER (XL) 150 MG PO TB24: 150.0000 mg | ORAL_TABLET | Freq: Every day | ORAL | 1 refills | Status: AC

## 2024-03-12 NOTE — Progress Notes (Signed)
 "  SUBJECTIVE:  Chief Complaint: Obesity  Interim History: patient had a good Thanksgiving with her family.  She voices over the next few weeks she is planning to have a break from watching her grandkids.  She is hoping to hang out with he husband and do a bit of food prep.  Her Weight Watchers meetings are in [place] now and that is limiting her ability to go.  She is craving more for sweets.  Bought cookbook by Sherryle Ned to have easy Sh[place] protein food options.   Jalan is here to discuss her progress with her obesity treatment plan. She is on the keeping a food journal and adhering to recommended goals of 1250-1350 calories and 85 grams of protein and states she is following her eating plan approximately 50 % of the time. She states she is exercising 50-60 minutes 3 times per week.   OBJECTIVE: Visit Diagnoses: Problem List Items Addressed This Visit       Other   Prediabetes - Primary   Other Visit Diagnoses       Depression with emotional eating       Relevant Medications   buPROPion  (WELLBUTRIN  XL) 150 MG 24 hr tablet     BMI 40.0-44.9, adult (HCC)         Morbid obesity (HCC)           Vitals Temp: 97.7 F (36.5 C) BP: 136/80 Pulse Rate: 62 SpO2: 100 %   Anthropometric Measurements Height: 5' 4 (1.626 m) Weight: 260 lb (117.9 kg) BMI (Calculated): 44.61 Weight at Last Visit: 264 lb Weight Lost Since Last Visit: 4 Weight Gained Since Last Visit: 0 Starting Weight: 255 lb Total Weight Loss (lbs): 0 lb (0 kg)   Body Composition  Body Fat %: 48.4 % Fat Mass (lbs): 125.8 lbs Muscle Mass (lbs): 127.4 lbs Total Body Water (lbs): 86.4 lbs Visceral Fat Rating : 17   Other Clinical Data Today's Visit #: 8 Starting Date: 08/02/23 Comments: 1250-1350/85     ASSESSMENT AND PLAN: Assessment & Plan Prediabetes Patient reports some increased intake of carbohydrate over the holiday season and with current schedule that she has watching her  grandchildren.  We discussed possibility of starting medication to help with prediabetes but opted for emotional eating option medication instead.  Will need repeat labs with primary care or here in the next 3 months. Depression with emotional eating Patient is interested in exploring options for emotional eating which often includes increased amounts of carbohydrate intake.  We discussed possibility of medications for emotional eating and jointly made the decision that patient would be best suited to start Wellbutrin  given possibility of increased energy, emotional eating control, and increased water intake.  Will start at 150 mg daily and follow-up at next appointment as to how patient has tolerated this. BMI 40.0-44.9, adult [facility])  Morbid obesity (HCC)    Diet: Hasini is currently in the action stage of change. As such, her goal is to continue with weight loss efforts and has agreed to keeping a food journal and adhering to recommended goals of 1250-1350 calories and 85 or more grams of protein.   Exercise:  For substantial health benefits, adults should do at least 150 minutes (2 hours and 30 minutes) a week of moderate-intensity, or 75 minutes (1 hour and 15 minutes) a week of vigorous-intensity aerobic physical activity, or an equivalent combination of moderate- and vigorous-intensity aerobic activity. Aerobic activity should be performed in episodes of at least 10 minutes,  and preferably, it should be spread throughout the week.  Behavior Modification:  We discussed the following Behavioral Modification Strategies today: increasing lean protein intake, decreasing simple carbohydrates, meal planning and cooking strategies, keeping healthy foods in the home, planning for success, and keep a strict food journal. We discussed various medication options to help Alexa with her weight loss efforts and we both agreed to start wellbutrin  to help with her cravings and sugar desires.  Return in  about 4 weeks (around 04/09/2024).   She was informed of the importance of frequent follow up visits to maximize her success with intensive lifestyle modifications for her multiple health conditions.  Attestation Statements:   Reviewed by clinician on day of visit: allergies, medications, problem list, medical history, surgical history, family history, social history, and previous encounter notes.   Adelita Cho, MD "

## 2024-03-18 NOTE — Assessment & Plan Note (Signed)
 Patient reports some increased intake of carbohydrate over the holiday season and with current schedule that she has watching her grandchildren.  We discussed possibility of starting medication to help with prediabetes but opted for emotional eating option medication instead.  Will need repeat labs with primary care or here in the next 3 months.

## 2024-04-04 ENCOUNTER — Other Ambulatory Visit (INDEPENDENT_AMBULATORY_CARE_PROVIDER_SITE_OTHER): Payer: Self-pay | Admitting: Family Medicine

## 2024-04-04 DIAGNOSIS — F339 Major depressive disorder, recurrent, unspecified: Secondary | ICD-10-CM

## 2024-04-16 ENCOUNTER — Encounter (INDEPENDENT_AMBULATORY_CARE_PROVIDER_SITE_OTHER): Payer: Self-pay | Admitting: Family Medicine

## 2024-04-16 ENCOUNTER — Ambulatory Visit (INDEPENDENT_AMBULATORY_CARE_PROVIDER_SITE_OTHER): Admitting: Family Medicine

## 2024-04-16 VITALS — BP 137/80 | HR 65 | Temp 98.5°F | Ht 64.0 in | Wt 259.0 lb

## 2024-04-16 DIAGNOSIS — Z6841 Body Mass Index (BMI) 40.0 and over, adult: Secondary | ICD-10-CM | POA: Diagnosis not present

## 2024-04-16 DIAGNOSIS — I10 Essential (primary) hypertension: Secondary | ICD-10-CM

## 2024-04-16 DIAGNOSIS — R7303 Prediabetes: Secondary | ICD-10-CM

## 2024-04-16 NOTE — Assessment & Plan Note (Addendum)
 Blood pressure well controlled today.  No chest pain, chest pressure or headache.  Recent start of Wellbutrin  which can increase BP.  Will continue current medication of Hyzaar at same dose.

## 2024-04-16 NOTE — Progress Notes (Signed)
 "  SUBJECTIVE:  Chief Complaint: Obesity  Interim History: Patient had a good end of December early January.  Everyone in her life has stayed healthy thus far.  Logged 6 days out of the week thus far.  Otherwise she has been logging her food consistently.  She did notice an improvement in energy on the Wellbutrin  but not so much a change in sweets consumption.  She wasn't sure if her Wellbutrin  was causing a bit of cramping in her upper abdominal. When she was logging she was around 1100-1200 calories and normally in the 70s in grams of protein daily. She has been doing boot camp at least 3 days a week and sometimes she can get up to 4 days a week. Over the next 4-6 weeks she is not anticipating much difference in her routine and schedule and is thinking she will still be able to spend time with family.   Vanessa Gardner is here to discuss her progress with her obesity treatment plan. She is on the keeping a food journal and adhering to recommended goals of 1250-1350 calories and 85 grams of protein and states she is following her eating plan approximately 90 % of the time. She states she is exercising 60 minutes 3-4 times per week.   OBJECTIVE: Visit Diagnoses: Problem List Items Addressed This Visit       Cardiovascular and Mediastinum   Essential hypertension     Other   Prediabetes - Primary   Other Visit Diagnoses       BMI 40.0-44.9, adult (HCC)         Morbid obesity (HCC)           Vitals Temp: 98.5 F (36.9 C) BP: 137/80 Pulse Rate: 65 SpO2: 99 %   Anthropometric Measurements Height: 5' 4 (1.626 m) Weight: 259 lb (117.5 kg) BMI (Calculated): 44.44 Weight at Last Visit: 260 lb Weight Lost Since Last Visit: 1 Weight Gained Since Last Visit: 0 Starting Weight: 255 lb Total Weight Loss (lbs): 0 lb (0 kg)   Body Composition  Body Fat %: 48.4 % Fat Mass (lbs): 125.4 lbs Muscle Mass (lbs): 127 lbs Total Body Water (lbs): 85 lbs Visceral Fat Rating : 17   Other  Clinical Data Today's Visit #: 9 Starting Date: 08/02/23 Comments: 1250-1350/85     ASSESSMENT AND PLAN: Assessment & Plan Prediabetes Last A1c of 5.7 but patient has been working consistently on food intake changes and has been mindful of simple carbohydrate quantity.  Will continue current dietary changes and needs repeat labs at next appointment. Essential hypertension Blood pressure well controlled today.  No chest pain, chest pressure or headache.  Recent start of Wellbutrin  which can increase BP.  Will continue current medication of Hyzaar at same dose. BMI 40.0-44.9, adult [facility])  Morbid obesity (HCC)    Diet: Vanessa Gardner is currently in the action stage of change. As such, her goal is to continue with weight loss efforts and has agreed to keeping a food journal and adhering to recommended goals of 1250-1350 calories and 85 or more grams of protein daily.   Exercise:  For additional and more extensive health benefits, adults should increase their aerobic physical activity to 300 minutes (5 hours) a week of moderate-intensity, or 150 minutes a week of vigorous-intensity aerobic physical activity, or an equivalent combination of moderate- and vigorous-intensity activity. Additional health benefits are gained by engaging in physical activity beyond this amount.  and Adults should also include muscle-strengthening activities that involve all major  muscle groups on 2 or more days a week.  Behavior Modification:  We discussed the following Behavioral Modification Strategies today: increasing lean protein intake, decreasing simple carbohydrates, increasing vegetables, meal planning and cooking strategies, keeping healthy foods in the home, planning for success, and keep a strict food journal. We discussed various medication options to help Vanessa Gardner with her weight loss efforts and we both agreed to continue Wellbutrin .  Return in about 6 weeks (around 05/28/2024).   She was informed of the  importance of frequent follow up visits to maximize her success with intensive lifestyle modifications for her multiple health conditions.  Attestation Statements:   Reviewed by clinician on day of visit: allergies, medications, problem list, medical history, surgical history, family history, social history, and previous encounter notes.   Adelita Cho, MD "

## 2024-04-17 ENCOUNTER — Ambulatory Visit (INDEPENDENT_AMBULATORY_CARE_PROVIDER_SITE_OTHER): Admitting: Family Medicine

## 2024-04-20 ENCOUNTER — Other Ambulatory Visit: Payer: Self-pay | Admitting: Primary Care

## 2024-04-20 DIAGNOSIS — E038 Other specified hypothyroidism: Secondary | ICD-10-CM

## 2024-04-20 DIAGNOSIS — I10 Essential (primary) hypertension: Secondary | ICD-10-CM

## 2024-04-21 NOTE — Telephone Encounter (Signed)
Patient is due for CPE/follow up in late February, this will be required prior to any further refills.  Please schedule, thank you!

## 2024-04-22 NOTE — Assessment & Plan Note (Signed)
 Last A1c of 5.7 but patient has been working consistently on food intake changes and has been mindful of simple carbohydrate quantity.  Will continue current dietary changes and needs repeat labs at next appointment.

## 2024-05-14 ENCOUNTER — Encounter: Admitting: Primary Care

## 2024-05-28 ENCOUNTER — Ambulatory Visit (INDEPENDENT_AMBULATORY_CARE_PROVIDER_SITE_OTHER): Admitting: Family Medicine
